# Patient Record
Sex: Male | Born: 1955 | Race: Black or African American | Hispanic: No | Smoking: Never smoker
Health system: Southern US, Community
[De-identification: ages and names within clinical notes are randomized; demographics above are authoritative.]

## PROBLEM LIST (undated history)

## (undated) DIAGNOSIS — I10 Essential (primary) hypertension: Secondary | ICD-10-CM

## (undated) DIAGNOSIS — E119 Type 2 diabetes mellitus without complications: Secondary | ICD-10-CM

## (undated) DIAGNOSIS — G4730 Sleep apnea, unspecified: Secondary | ICD-10-CM

## (undated) DIAGNOSIS — M1990 Unspecified osteoarthritis, unspecified site: Secondary | ICD-10-CM

## (undated) DIAGNOSIS — F32A Depression, unspecified: Secondary | ICD-10-CM

---

## 2022-01-02 ENCOUNTER — Emergency Department (HOSPITAL_COMMUNITY): Payer: No Typology Code available for payment source

## 2022-01-02 ENCOUNTER — Emergency Department (HOSPITAL_COMMUNITY): Payer: No Typology Code available for payment source | Admitting: Anesthesiology

## 2022-01-02 ENCOUNTER — Inpatient Hospital Stay (HOSPITAL_COMMUNITY)
Admission: EM | Admit: 2022-01-02 | Discharge: 2022-01-05 | DRG: 093 | Disposition: A | Discharge status: Rehabilitation Facility | Payer: No Typology Code available for payment source | Attending: Neurosurgery | Admitting: Neurosurgery

## 2022-01-02 ENCOUNTER — Encounter (HOSPITAL_COMMUNITY)
Admission: EM | Disposition: A | Discharge status: Rehabilitation Facility | Payer: Self-pay | Source: Home / Self Care | Attending: Neurosurgery

## 2022-01-02 DIAGNOSIS — M25511 Pain in right shoulder: Secondary | ICD-10-CM | POA: Diagnosis present

## 2022-01-02 DIAGNOSIS — G8191 Hemiplegia, unspecified affecting right dominant side: Secondary | ICD-10-CM | POA: Diagnosis present

## 2022-01-02 DIAGNOSIS — Y9355 Activity, bike riding: Secondary | ICD-10-CM

## 2022-01-02 DIAGNOSIS — Z20822 Contact with and (suspected) exposure to covid-19: Secondary | ICD-10-CM | POA: Diagnosis present

## 2022-01-02 DIAGNOSIS — T1490XA Injury, unspecified, initial encounter: Secondary | ICD-10-CM

## 2022-01-02 DIAGNOSIS — M25571 Pain in right ankle and joints of right foot: Secondary | ICD-10-CM | POA: Diagnosis present

## 2022-01-02 DIAGNOSIS — F32A Depression, unspecified: Secondary | ICD-10-CM | POA: Diagnosis present

## 2022-01-02 DIAGNOSIS — M1990 Unspecified osteoarthritis, unspecified site: Secondary | ICD-10-CM

## 2022-01-02 DIAGNOSIS — Z91040 Latex allergy status: Secondary | ICD-10-CM

## 2022-01-02 DIAGNOSIS — Z79899 Other long term (current) drug therapy: Secondary | ICD-10-CM

## 2022-01-02 DIAGNOSIS — E1142 Type 2 diabetes mellitus with diabetic polyneuropathy: Secondary | ICD-10-CM | POA: Diagnosis present

## 2022-01-02 DIAGNOSIS — S14109A Unspecified injury at unspecified level of cervical spinal cord, initial encounter: Secondary | ICD-10-CM | POA: Diagnosis present

## 2022-01-02 DIAGNOSIS — K5900 Constipation, unspecified: Secondary | ICD-10-CM | POA: Diagnosis present

## 2022-01-02 DIAGNOSIS — G9520 Unspecified cord compression: Principal | ICD-10-CM | POA: Diagnosis present

## 2022-01-02 DIAGNOSIS — M5030 Other cervical disc degeneration, unspecified cervical region: Secondary | ICD-10-CM | POA: Diagnosis present

## 2022-01-02 DIAGNOSIS — R102 Pelvic and perineal pain: Secondary | ICD-10-CM | POA: Diagnosis present

## 2022-01-02 DIAGNOSIS — Z8249 Family history of ischemic heart disease and other diseases of the circulatory system: Secondary | ICD-10-CM

## 2022-01-02 DIAGNOSIS — Z7984 Long term (current) use of oral hypoglycemic drugs: Secondary | ICD-10-CM

## 2022-01-02 DIAGNOSIS — E785 Hyperlipidemia, unspecified: Secondary | ICD-10-CM | POA: Diagnosis present

## 2022-01-02 DIAGNOSIS — E876 Hypokalemia: Secondary | ICD-10-CM | POA: Diagnosis present

## 2022-01-02 DIAGNOSIS — M4802 Spinal stenosis, cervical region: Secondary | ICD-10-CM | POA: Diagnosis present

## 2022-01-02 DIAGNOSIS — S31119A Laceration without foreign body of abdominal wall, unspecified quadrant without penetration into peritoneal cavity, initial encounter: Secondary | ICD-10-CM

## 2022-01-02 DIAGNOSIS — M2578 Osteophyte, vertebrae: Secondary | ICD-10-CM | POA: Diagnosis present

## 2022-01-02 DIAGNOSIS — I10 Essential (primary) hypertension: Secondary | ICD-10-CM | POA: Diagnosis present

## 2022-01-02 DIAGNOSIS — Z888 Allergy status to other drugs, medicaments and biological substances status: Secondary | ICD-10-CM

## 2022-01-02 HISTORY — DX: Essential (primary) hypertension: I10

## 2022-01-02 HISTORY — DX: Depression, unspecified: F32.A

## 2022-01-02 HISTORY — DX: Type 2 diabetes mellitus without complications: E11.9

## 2022-01-02 HISTORY — DX: Sleep apnea, unspecified: G47.30

## 2022-01-02 HISTORY — DX: Unspecified osteoarthritis, unspecified site: M19.90

## 2022-01-02 LAB — COMPREHENSIVE METABOLIC PANEL
ALT: 16 U/L (ref 0–44)
AST: 22 U/L (ref 15–41)
Albumin: 4 g/dL (ref 3.5–5.0)
Alkaline Phosphatase: 41 U/L (ref 38–126)
Anion gap: 9 (ref 5–15)
BUN: 9 mg/dL (ref 8–23)
CO2: 25 mmol/L (ref 22–32)
Calcium: 9.2 mg/dL (ref 8.9–10.3)
Chloride: 104 mmol/L (ref 98–111)
Creatinine, Ser: 1.07 mg/dL (ref 0.61–1.24)
GFR, Estimated: >60 mL/min (ref >60)
Glucose, Bld: 107 mg/dL — ABNORMAL HIGH (ref 70–99)
Potassium: 3.1 mmol/L — ABNORMAL LOW (ref 3.5–5.1)
Sodium: 138 mmol/L (ref 135–145)
Total Bilirubin: 1 mg/dL (ref 0.3–1.2)
Total Protein: 7 g/dL (ref 6.5–8.1)

## 2022-01-02 LAB — URINALYSIS, ROUTINE W REFLEX MICROSCOPIC
Bilirubin Urine: NEGATIVE
Glucose, UA: NEGATIVE mg/dL
Hgb urine dipstick: NEGATIVE
Ketones, ur: NEGATIVE mg/dL
Leukocytes,Ua: NEGATIVE
Nitrite: NEGATIVE
Protein, ur: 100 mg/dL — AB
Specific Gravity, Urine: 1.02 (ref 1.005–1.030)
pH: 7.5 (ref 5.0–8.0)

## 2022-01-02 LAB — CBC
HCT: 35.4 % — ABNORMAL LOW (ref 39.0–52.0)
Hemoglobin: 11.3 g/dL — ABNORMAL LOW (ref 13.0–17.0)
MCH: 20 pg — ABNORMAL LOW (ref 26.0–34.0)
MCHC: 31.9 g/dL (ref 30.0–36.0)
MCV: 62.7 fL — ABNORMAL LOW (ref 80.0–100.0)
Platelets: 277 10*3/uL (ref 150–400)
RBC: 5.65 MIL/uL (ref 4.22–5.81)
RDW: 17.7 % — ABNORMAL HIGH (ref 11.5–15.5)
WBC: 12.7 10*3/uL — ABNORMAL HIGH (ref 4.0–10.5)
nRBC: 0 % (ref 0.0–0.2)

## 2022-01-02 LAB — URINALYSIS, MICROSCOPIC (REFLEX)

## 2022-01-02 LAB — I-STAT CHEM 8, ED
BUN: 10 mg/dL (ref 8–23)
Calcium, Ion: 1.16 mmol/L (ref 1.15–1.40)
Chloride: 101 mmol/L (ref 98–111)
Creatinine, Ser: 0.9 mg/dL (ref 0.61–1.24)
Glucose, Bld: 109 mg/dL — ABNORMAL HIGH (ref 70–99)
HCT: 40 % (ref 39.0–52.0)
Hemoglobin: 13.6 g/dL (ref 13.0–17.0)
Potassium: 3.2 mmol/L — ABNORMAL LOW (ref 3.5–5.1)
Sodium: 140 mmol/L (ref 135–145)
TCO2: 28 mmol/L (ref 22–32)

## 2022-01-02 LAB — SAMPLE TO BLOOD BANK

## 2022-01-02 LAB — RESP PANEL BY RT-PCR (FLU A&B, COVID) ARPGX2
Influenza A by PCR: NEGATIVE
Influenza B by PCR: NEGATIVE
SARS Coronavirus 2 by RT PCR: NEGATIVE

## 2022-01-02 LAB — PROTIME-INR
INR: 1.1 (ref 0.8–1.2)
Prothrombin Time: 14.2 seconds (ref 11.4–15.2)

## 2022-01-02 LAB — LACTIC ACID, PLASMA: Lactic Acid, Venous: 2.1 mmol/L (ref 0.5–1.9)

## 2022-01-02 LAB — CBG MONITORING, ED: Glucose-Capillary: 126 mg/dL — ABNORMAL HIGH (ref 70–99)

## 2022-01-02 LAB — ETHANOL: Alcohol, Ethyl (B): <10 mg/dL (ref <10)
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
## 2022-01-02 IMAGING — MR MR HEAD W/O CM
6 of 11 series · 26 of 48 positions shown · non-contrast
Comparison: CT from earlier the same day.

CLINICAL DATA: Initial evaluation for neuro deficit, stroke
suspected.

EXAM:
MRI HEAD WITHOUT CONTRAST
TECHNIQUE: Multiplanar, multiecho pulse sequences of the brain and surrounding
structures were obtained without intravenous contrast.

[Series 3: DWI · axial · 3.0mm · 0.94mm/px · z∈[-54,+111]mm · 9 of 112 slices shown (1 of 2)]
[im 1/112]
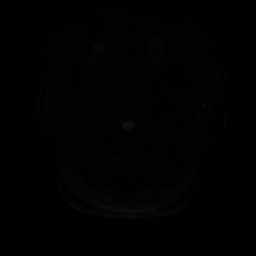
[im 14/112]
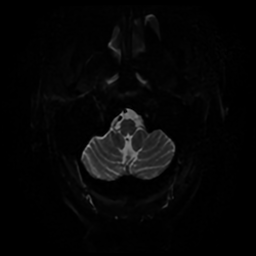
[im 28/112]
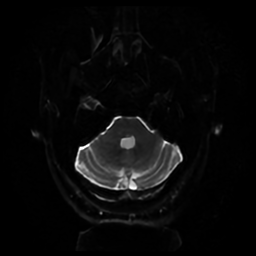
[im 42/112]
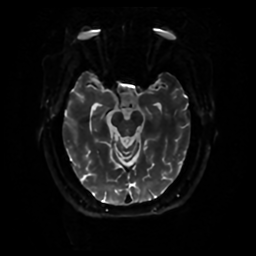
[im 56/112]
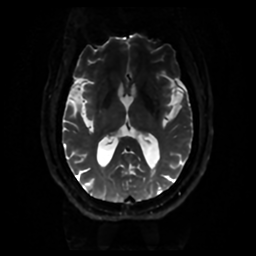
[im 70/112]
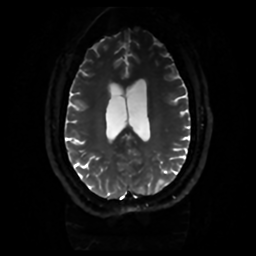
[im 84/112]
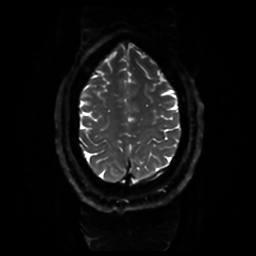
[im 98/112]
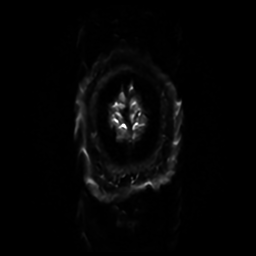
[im 112/112]
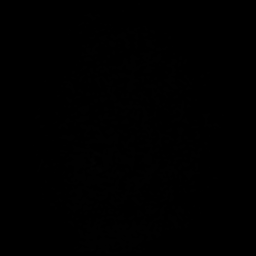

[Series 4: DWI · coronal · 4.0mm · 0.94mm/px · 5 of 76 slices shown (2 of 2)]
[im 1/76]
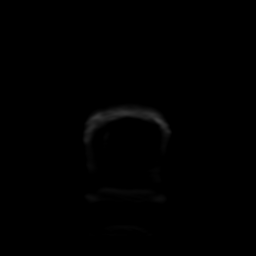
[im 19/76]
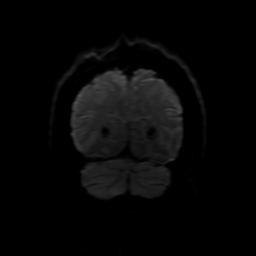
[im 38/76]
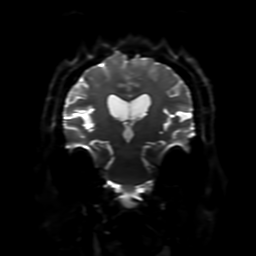
[im 57/76]
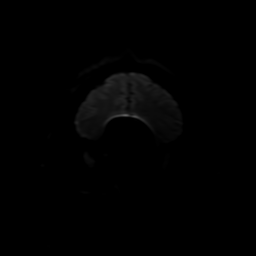
[im 76/76]
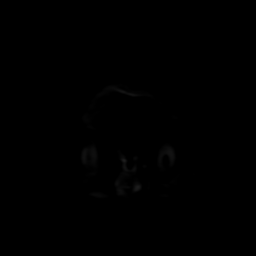

[Series 5: FLAIR · sagittal · 5.0mm · 0.23mm/px · 2 of 26 slices shown (1 of 2)]
[im 1/26]
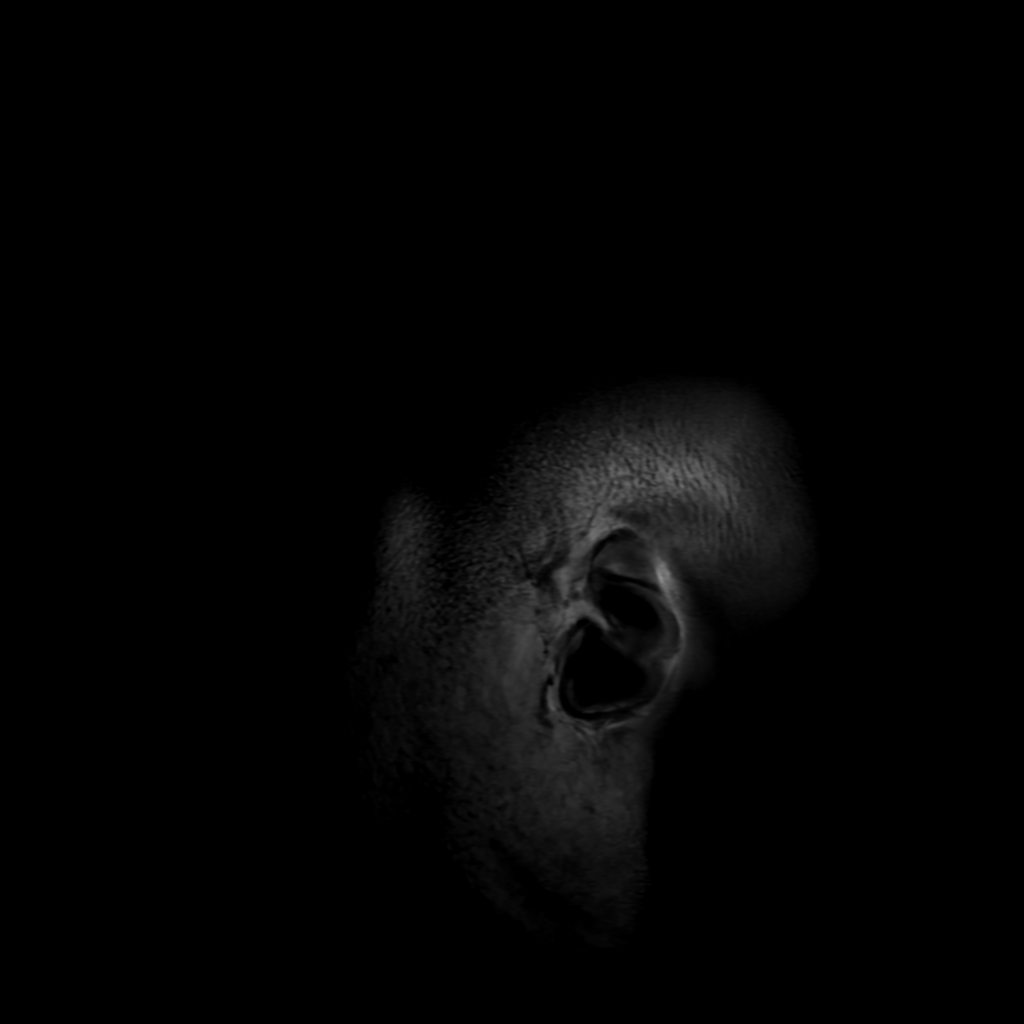
[im 26/26]
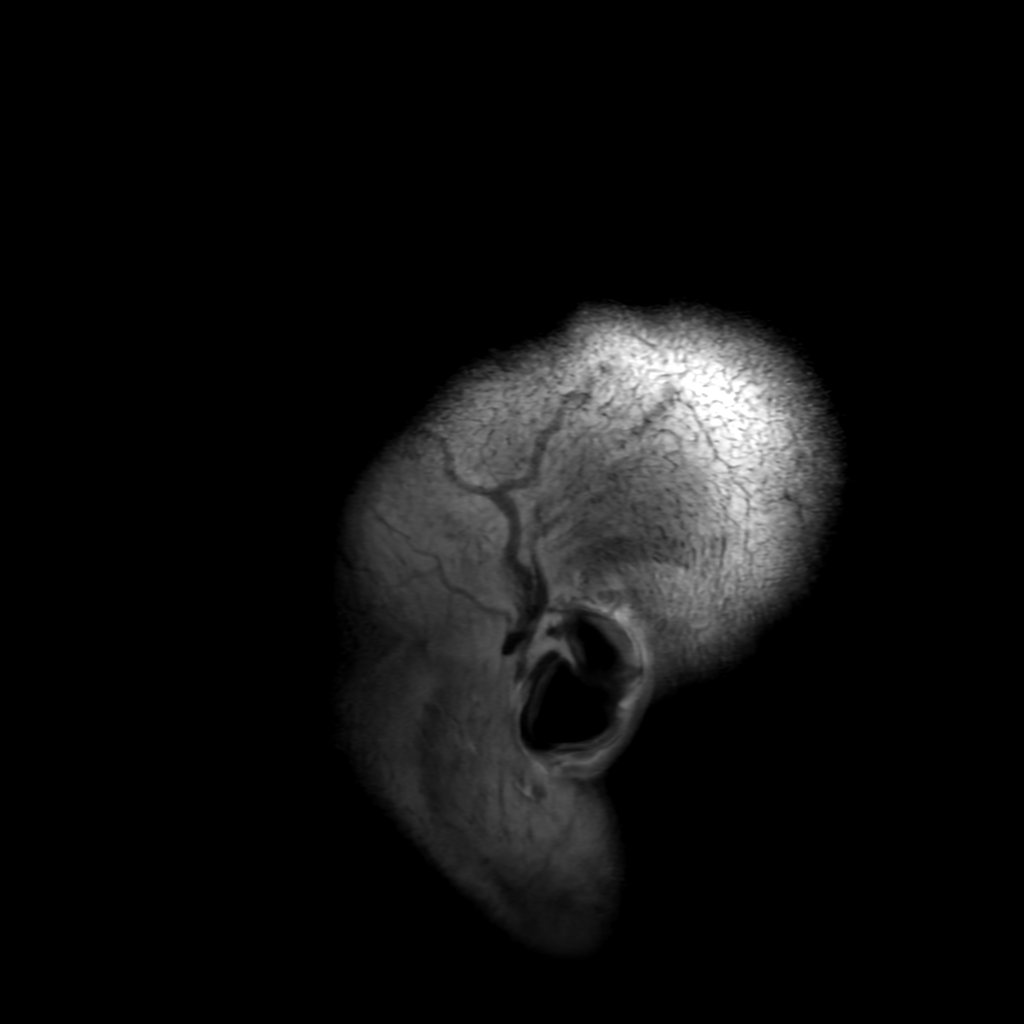

[Series 7: FLAIR · axial · 4.0mm · 0.45mm/px · z∈[-49,+113]mm · 3 of 38 slices shown (2 of 2)]
[im 1/38]
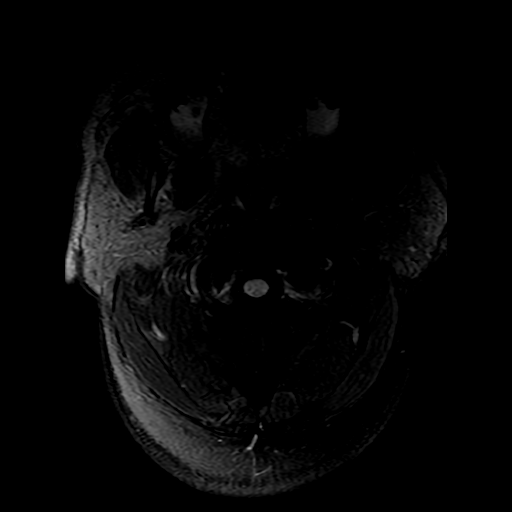
[im 19/38]
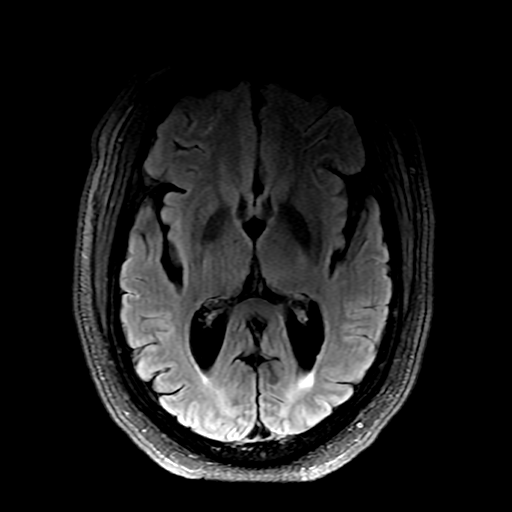
[im 38/38]
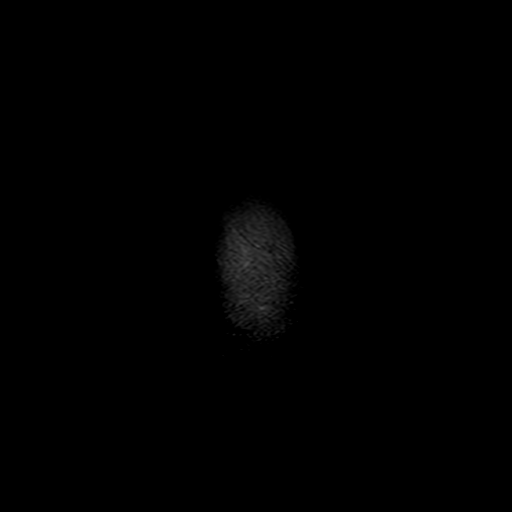

[Series 350: ADC · axial · 3.0mm · 0.94mm/px · z∈[-54,+111]mm · 4 of 56 slices shown (1 of 2)]
[im 1/56]
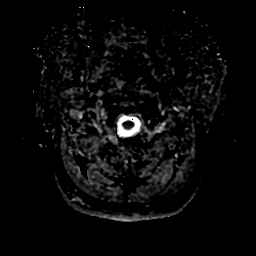
[im 19/56]
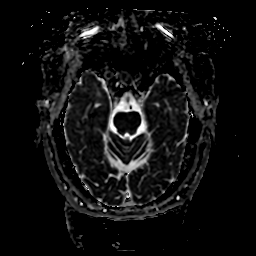
[im 37/56]
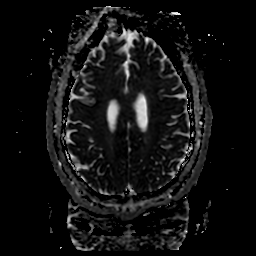
[im 56/56]
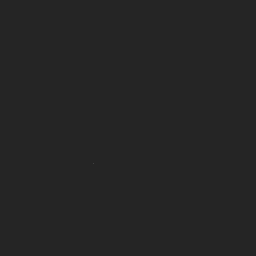

[Series 450: ADC · coronal · 4.0mm · 0.94mm/px · 3 of 38 slices shown (2 of 2)]
[im 1/38]
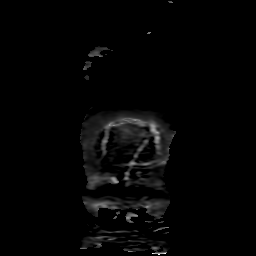
[im 19/38]
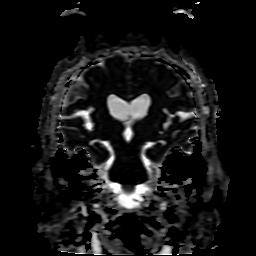
[im 38/38]
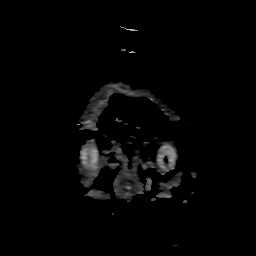

[26 of 48 positions shown; findings below may reference images not displayed]

FINDINGS: Brain: Cerebral volume within normal limits. Small remote lacunar
infarct noted at the right caudate body. No significant cerebral
white matter disease.

No evidence for acute or subacute infarct. Gray-white matter
differentiation maintained. No remote cortical infarction. No acute
or chronic intracranial hemorrhage.

No mass lesion, mass effect or midline shift. No hydrocephalus or
extra-axial fluid collection. Pituitary gland suprasellar region
normal. Right cerebellar DVA noted.

Vascular: Major intracranial vascular flow voids are maintained.

Skull and upper cervical spine: Craniocervical junction normal. Bone
marrow signal intensity within normal limits. No visible scalp soft
tissue abnormality by MRI.

Sinuses/Orbits: Globes and orbital soft tissues demonstrate no acute
finding. Scattered retention cyst noted within the maxillary
sinuses. No significant mastoid effusion.

Other: None.
IMPRESSION: 1. No acute intracranial abnormality.
2. Small remote lacunar infarct involving the right caudate body.
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
## 2022-01-02 IMAGING — MR MR CERVICAL SPINE W/O CM
4 of 6 series · 16 of 48 positions shown · non-contrast
Comparison: CT from earlier the same day.

CLINICAL DATA: Initial evaluation for trauma, paresthesias.

EXAM:
MRI CERVICAL SPINE WITHOUT CONTRAST
TECHNIQUE: Multiplanar, multisequence MR imaging of the cervical spine was
performed. No intravenous contrast was administered.

[Series 2: T2 · sagittal · 3.0mm · 0.43mm/px · 4 of 18 slices shown (1 of 2)]
[im 1/18]
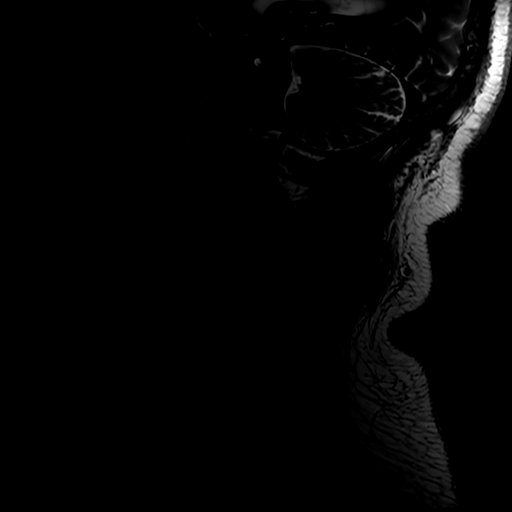
[im 6/18]
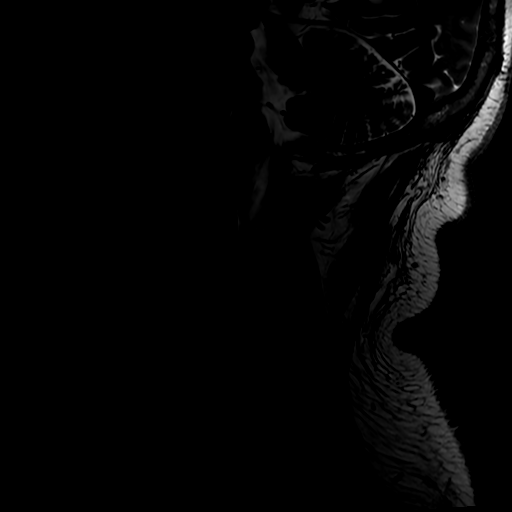
[im 12/18]
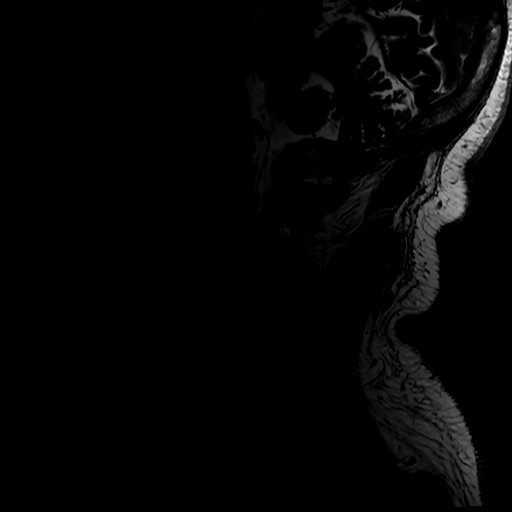
[im 18/18]
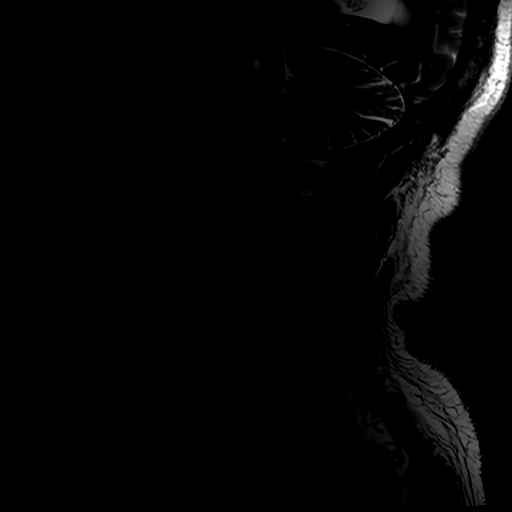

[Series 4: STIR · sagittal · 3.0mm · 0.43mm/px · 3 of 18 slices shown]
[im 1/18]
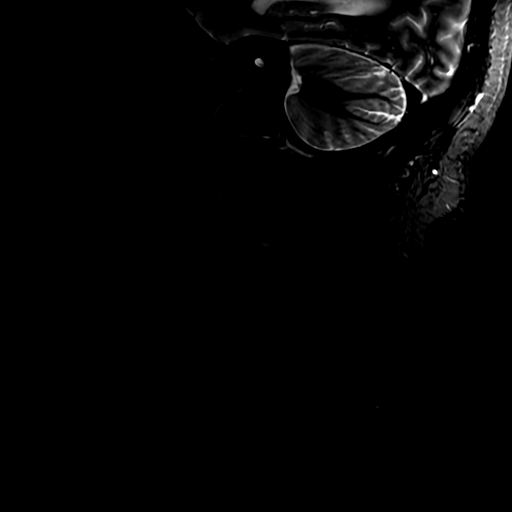
[im 9/18]
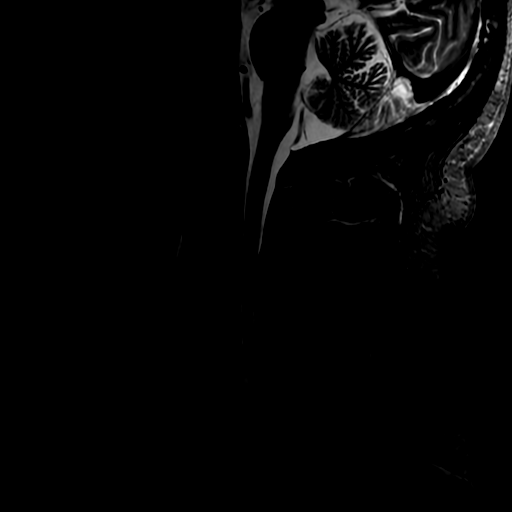
[im 18/18]
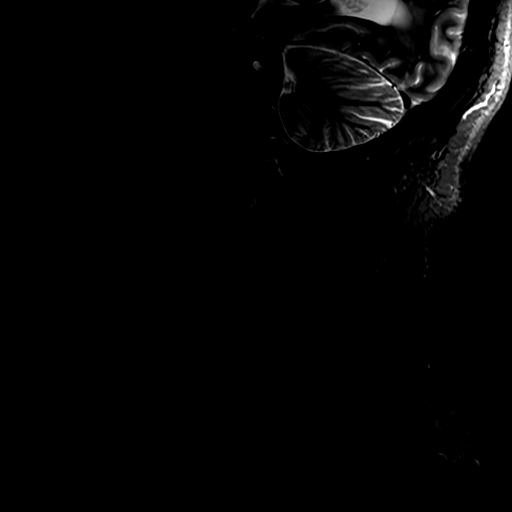

[Series 6: T2 · axial · 3.0mm · 0.35mm/px · z∈[-226,-118]mm · 6 of 40 slices shown (2 of 2)]
[im 1/40]
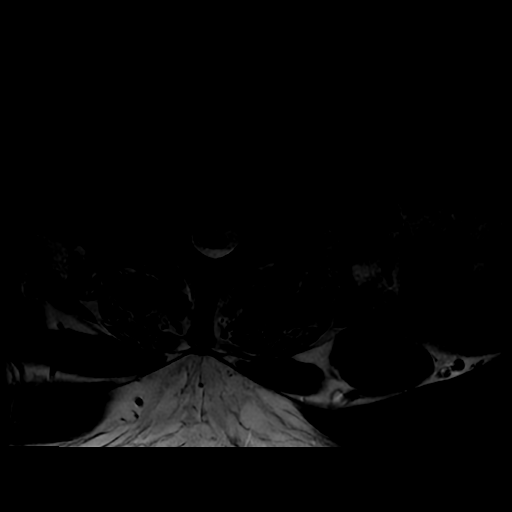
[im 6/40]
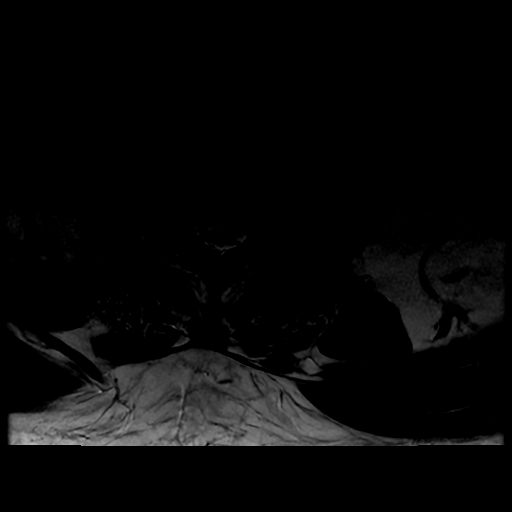
[im 12/40]
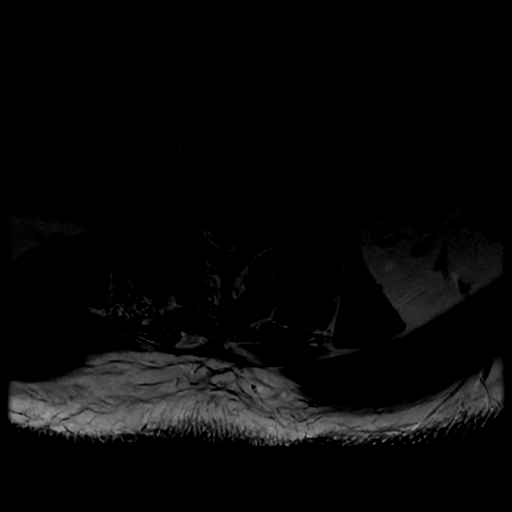
[im 17/40]
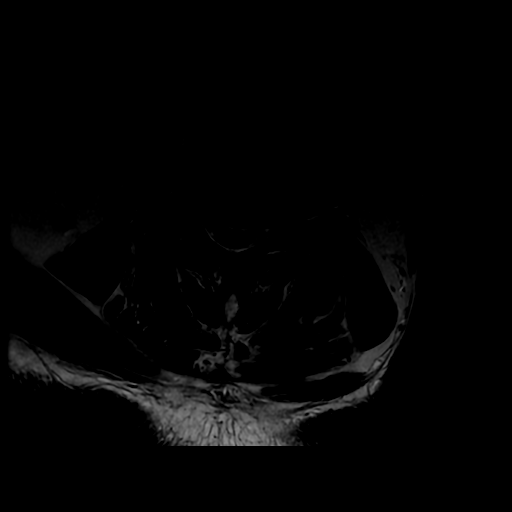
[im 23/40]
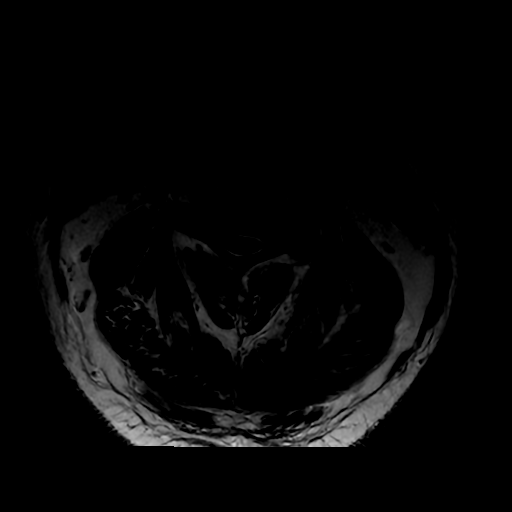
[im 34/40]
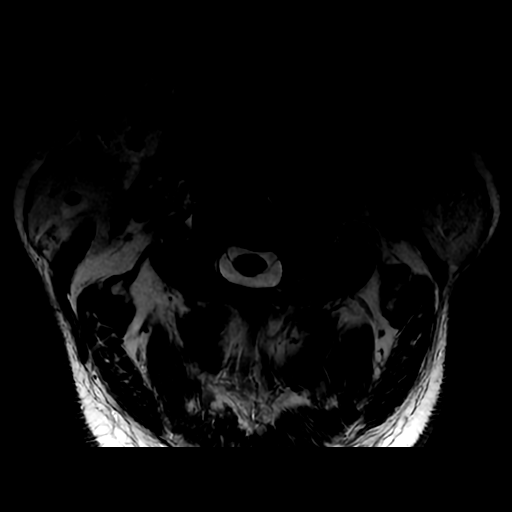

[Series 7: T1 · axial · non-contrast · 3.0mm · 0.35mm/px · z∈[-210,-118]mm · 3 of 40 slices shown]
[im 6/40]
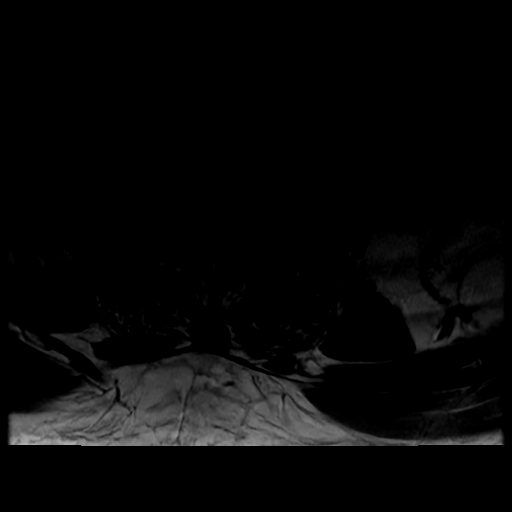
[im 23/40]
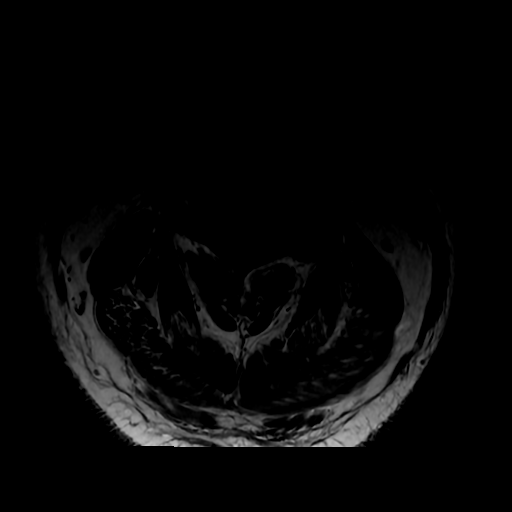
[im 34/40]
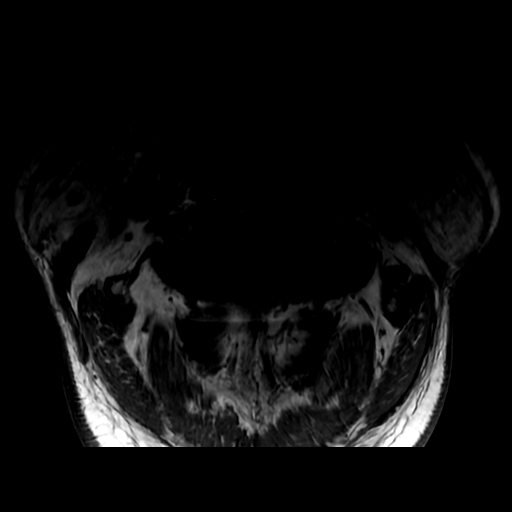

[16 of 48 positions shown; findings below may reference images not displayed]

FINDINGS: Alignment: Straightening of the normal cervical lordosis. Trace
retrolisthesis of C3 on C4, likely chronic and degenerative.

Vertebrae: There is question of a T2/stir hyperintense linear defect
extending through an anterior osteophyte at the level of C7-T1
(series 4, image 8). Focal lucency seen at this level on prior CT.
While this finding is age indeterminate, a possible acute
nondisplaced fracture could be present. Otherwise, vertebral body
height maintained with no other visible acute or chronic fracture.
Bone marrow signal intensity heterogeneous without worrisome osseous
lesion. No other abnormal marrow edema.

Cord: Patchy signal abnormality seen involving the cervical spinal
cord at the level of C3-4, suspicious for acute cord
injury/contusion (series 6, image 16). Additional signal abnormality
noted involving the right dorsal cord slightly inferiorly at the
level of C4-5 (series 6, image 21), also suspicious for cord injury.
Probable involvement of the left hemi cord at this level as well,
best seen on sagittal sequence (series 2, image 9).

Posterior Fossa, vertebral arteries, paraspinal tissues: Visualized
brain and posterior fossa within normal limits. Craniocervical
junction normal. Question of paraspinous edema involving the
prevertebral soft tissues at C6 through the upper thoracic spine,
suspected to be related to the adjacent acute nondisplaced fracture
as above (series 4, image 8). Question possible disruption of the
anterior longitudinal ligament at the level of the fracture noted as
well (series 4, image 6). Ligamentous structures otherwise intact.
Normal flow voids seen within the vertebral arteries bilaterally.

Disc levels:

C2-C3: Negative interspace. Mild facet hypertrophy. No significant
spinal stenosis. Foramina remain patent.

C3-C4: Trace retrolisthesis with intervertebral disc space
narrowing. Diffuse disc osteophyte complex with bilateral
uncovertebral spurring. Broad posterior component flattens and
effaces the ventral thecal sac. Superimposed facet and ligament
flavum hypertrophy. Resultant severe spinal stenosis with the thecal
sac measuring 7 mm in AP diameter. Secondary cord flattening with
cord signal changes as above. Severe bilateral C4 foraminal
stenosis.

C4-C5: Degenerative intervertebral disc space narrowing with diffuse
disc osteophyte complex. Broad posterior component flattens and
effaces the ventral thecal sac. Mild cord flattening with associated
right-sided cord signal changes as above. Moderate spinal stenosis.
Mild bilateral C5 foraminal narrowing.

C5-C6: Degenerative intervertebral disc space narrowing with diffuse
disc osteophyte complex. Mild flattening of the ventral thecal sac
without significant spinal stenosis. Moderate left worse than right
C6 foraminal narrowing.

C6-C7: Minimal disc bulge with uncovertebral spurring. No spinal
stenosis. Mild left C7 foraminal narrowing. Right neural foramina
remains patent.

C7-T1: Mild disc bulge with endplate and uncovertebral spurring.
Mild facet hypertrophy. No spinal stenosis. Mild to moderate right
worse than left C8 foraminal narrowing.
IMPRESSION: 1. Question nondisplaced fracture extending through an anterior
bridging osteophyte at the level of C7-T1. While this finding is age
indeterminate, there is adjacent prevertebral edema suggesting that
this is could be acute in nature. Correlation with physical exam for
possible pain at this location recommended.
2. Question possible defect extending through the anterior
longitudinal ligament at the level of the above described C7-T1
fracture, which could reflect focal ligamentous injury. No other
evidence for ligamentous injury within the cervical spine.
3. Patchy signal abnormality involving the cervical spinal cord at
the level of C3-4 and C4-5 as above, concerning for acute cord
injury/contusion.
4. Multifactorial degenerative changes at C3-4 and C4-5 with
resultant moderate to severe spinal stenosis. Associated severe
bilateral C4, mild C5, and moderate C6 foraminal narrowing as above.
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
## 2022-01-02 IMAGING — CT CT HEAD W/O CM
4 series · 16 of 47 positions shown, 18 images · non-contrast
Comparison: None.

CLINICAL DATA: Fall.

EXAM:
CT HEAD WITHOUT CONTRAST
CT CERVICAL SPINE WITHOUT CONTRAST
TECHNIQUE: Multidetector CT imaging of the head and cervical spine was
performed following the standard protocol without intravenous
contrast. Multiplanar CT image reconstructions of the cervical spine
were also generated.

[Series 3: head wo · axial · 0.45mm/px · z∈[-124,-4]mm · 7 of 34 slices shown, 9 images]
[im 5/34  brain]
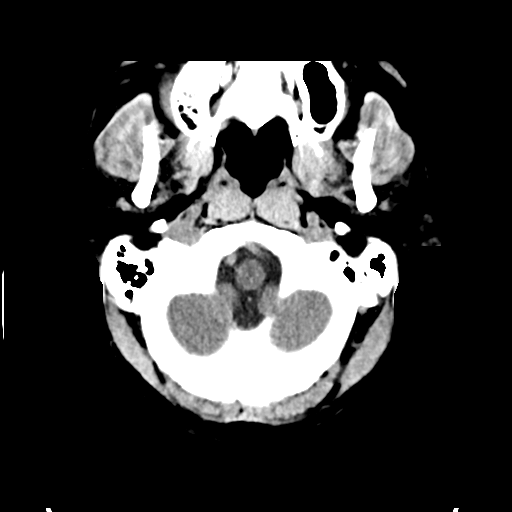
[im 5/34  bone]
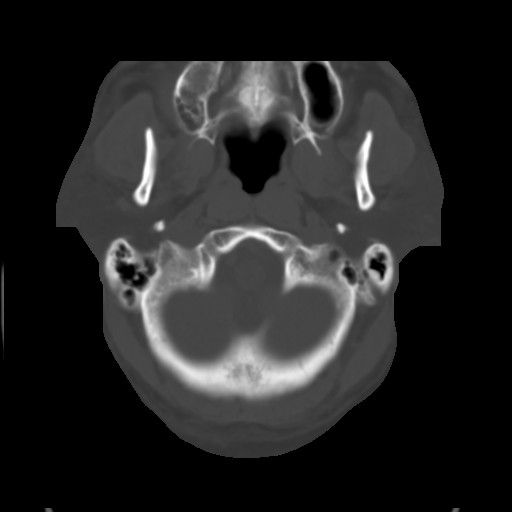
[im 9/34  brain]
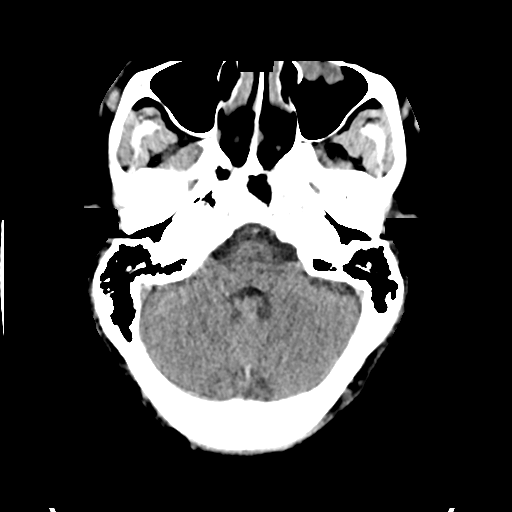
[im 13/34  brain]
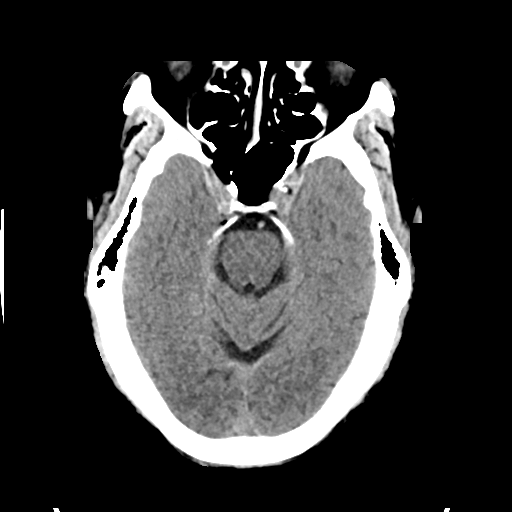
[im 17/34  brain]
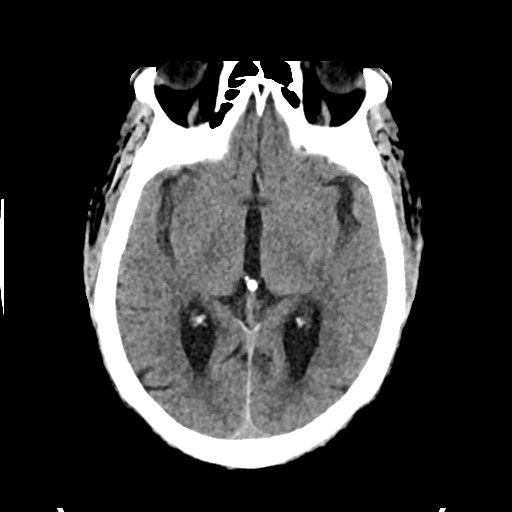
[im 21/34  brain]
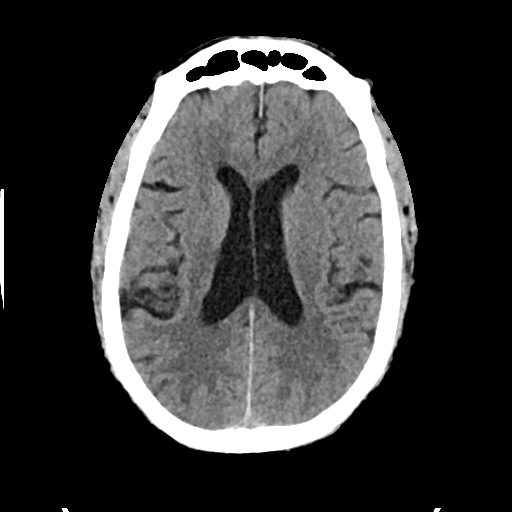
[im 21/34  bone]
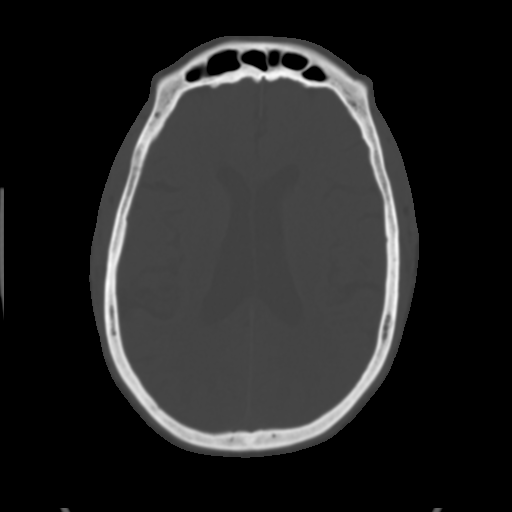
[im 25/34  brain]
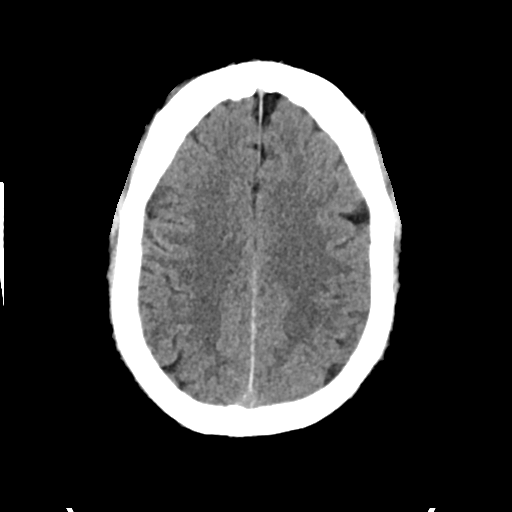
[im 29/34  brain]
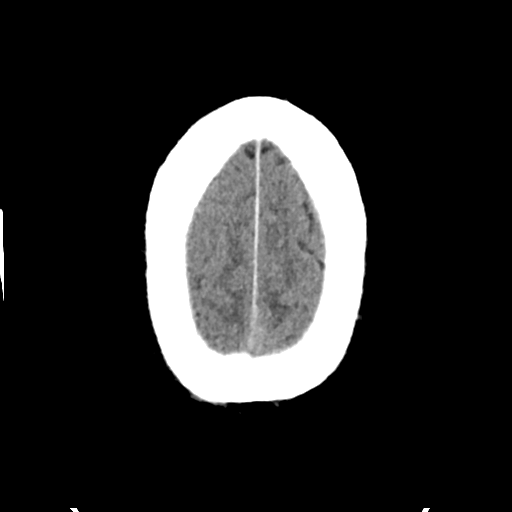

[Series 4: head bone · axial · 0.45mm/px · z∈[-128,-96]mm · 3 of 83 slices shown]
[im 9/83  bone]
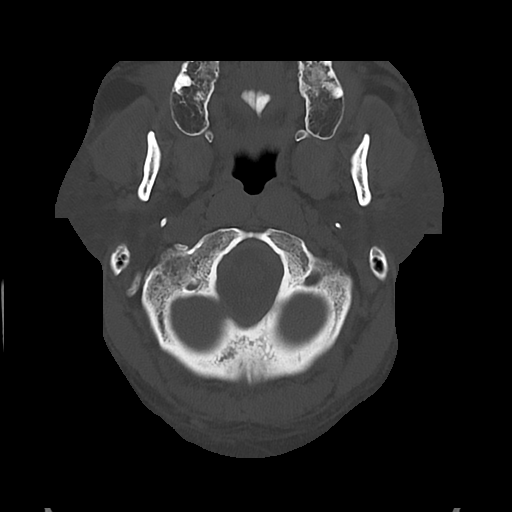
[im 17/83  bone]
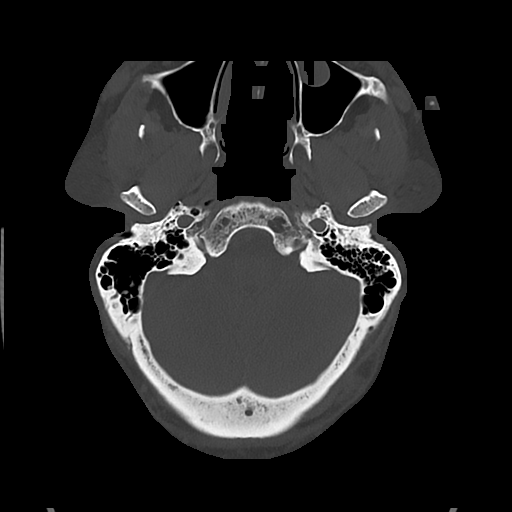
[im 25/83  bone]
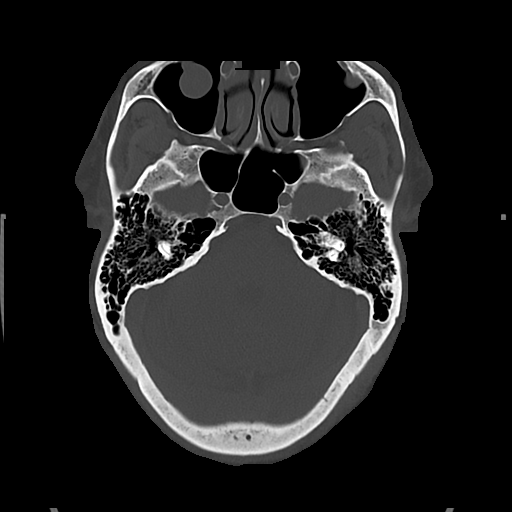

[Series 5: cor soft · coronal · 0.37mm/px · 3 of 75 slices shown]
[im 25/75  brain]
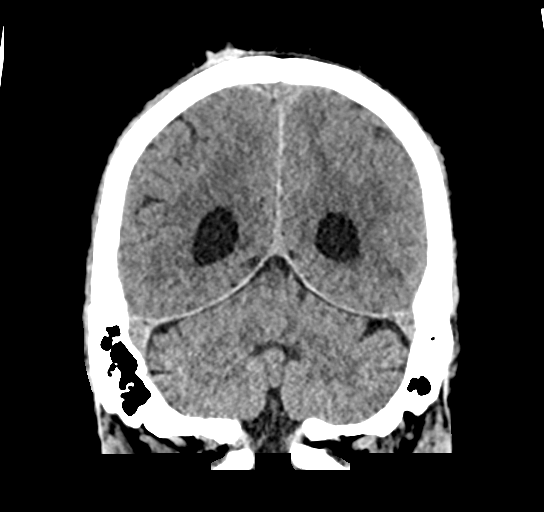
[im 33/75  brain]
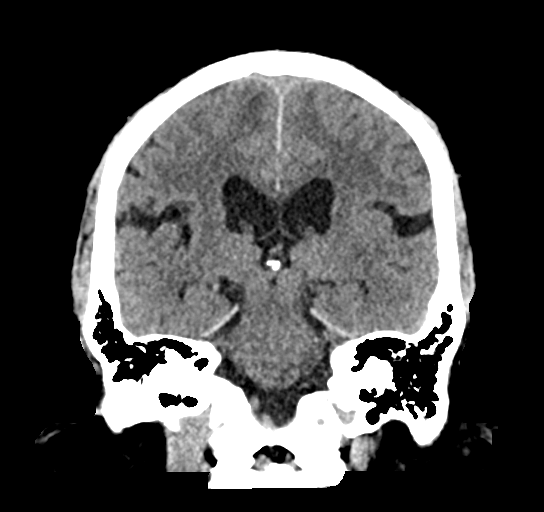
[im 42/75  brain]
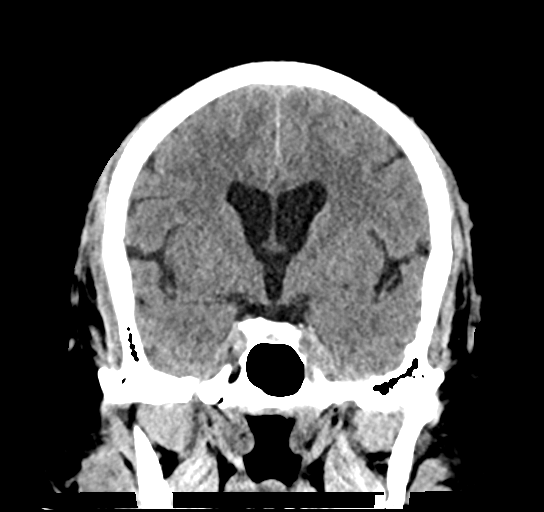

[Series 6: sag soft · sagittal · 0.37mm/px · 3 of 68 slices shown]
[im 23/68  brain]
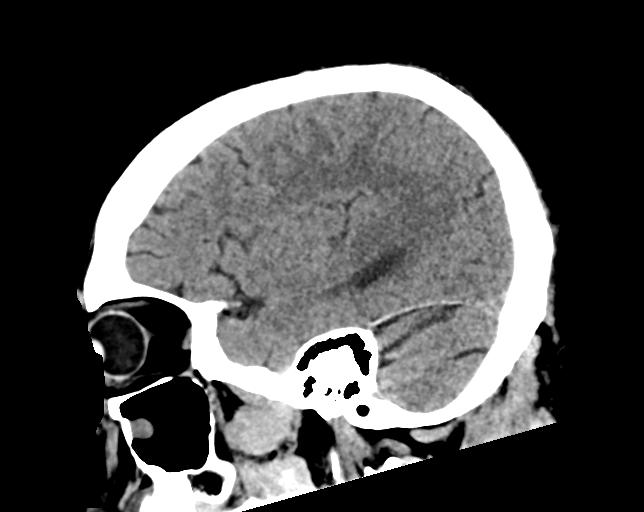
[im 34/68  brain]
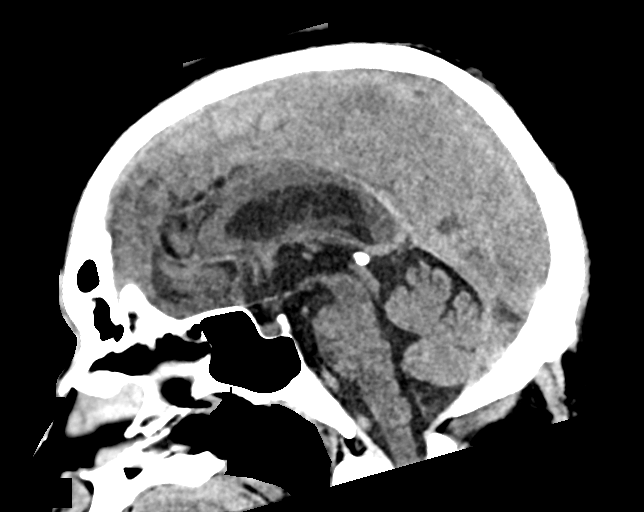
[im 45/68  brain]
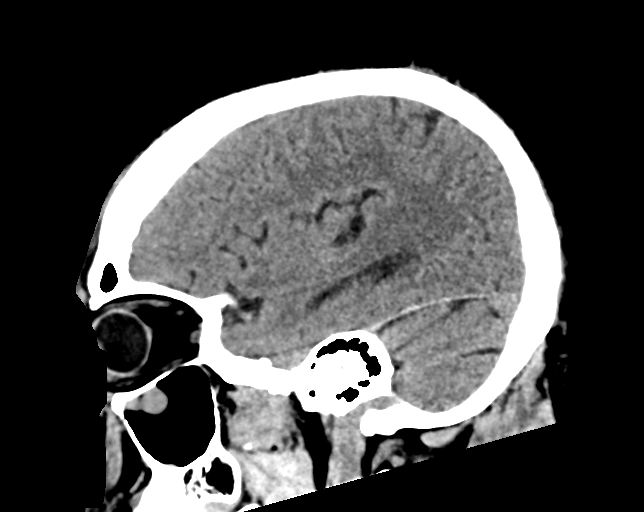

[16 of 47 positions shown; findings below may reference images not displayed]

FINDINGS: CT HEAD FINDINGS

Brain: No evidence of acute infarction, hemorrhage, hydrocephalus,
extra-axial collection or mass lesion/mass effect.

Vascular: No hyperdense vessel or unexpected calcification.

Skull: Normal. Negative for fracture or focal lesion.

Sinuses/Orbits: No acute finding.

Other: Small right posterior scalp hematoma is noted.

CT CERVICAL SPINE FINDINGS

Alignment: Minimal grade 1 retrolisthesis of C4-5 is noted secondary
to severe degenerative disc disease at this level.

Skull base and vertebrae: No acute fracture. No primary bone lesion
or focal pathologic process.

Soft tissues and spinal canal: No prevertebral fluid or swelling. No
visible canal hematoma.

Disc levels: Severe degenerative disc disease is noted at C4-5 and
C5-6. Moderate degenerative disc disease is noted at C3-4. Moderate
to large amount of anterior osteophyte formation is noted at all
levels of the cervical spine.

Upper chest: Negative.

Other: None.
IMPRESSION: Small right posterior scalp hematoma. No acute intracranial
abnormality seen.

Multilevel degenerative disc disease is noted in the cervical spine.
No acute abnormality is noted.

## 2022-01-02 IMAGING — MR MR THORACIC SPINE W/O CM
4 of 6 series · 16 of 48 positions shown · non-contrast
Comparison: Prior CT from earlier the same day.
COMPARISON: Prior CT from earlier the same day.

Addendum:
CLINICAL DATA: Initial evaluation for numbness and tingling,
weakness in right arm and leg.

EXAM:
MRI THORACIC SPINE WITHOUT CONTRAST
TECHNIQUE: Multiplanar, multisequence MR imaging of the thoracic spine was
performed. No intravenous contrast was administered.

[Series 5: T1 · sagittal · 3.0mm · 0.90mm/px · 3 of 16 slices shown (1 of 2)]
[im 1/16]
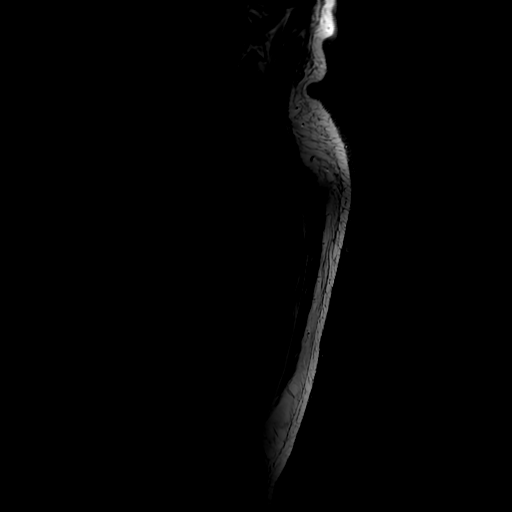
[im 8/16]
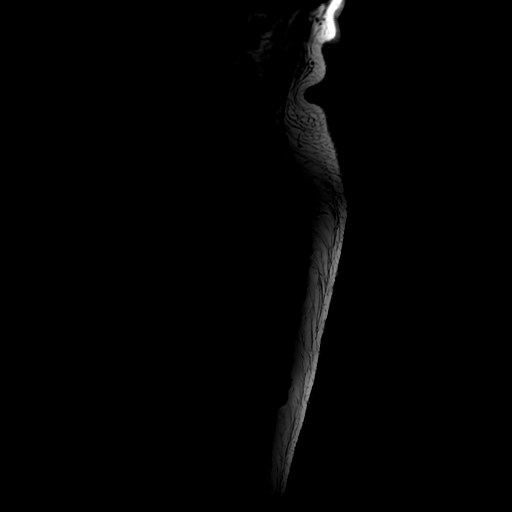
[im 16/16]
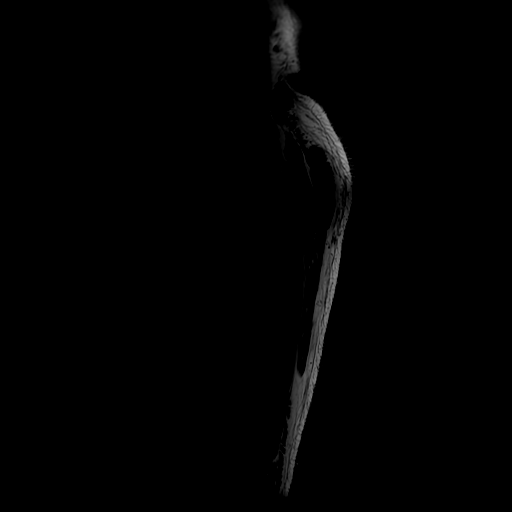

[Series 7: T2 · sagittal · 3.0mm · 0.66mm/px · 5 of 18 slices shown (1 of 2)]
[im 1/18]
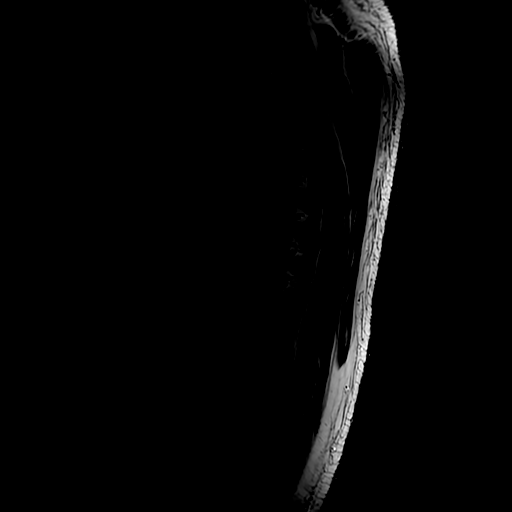
[im 5/18]
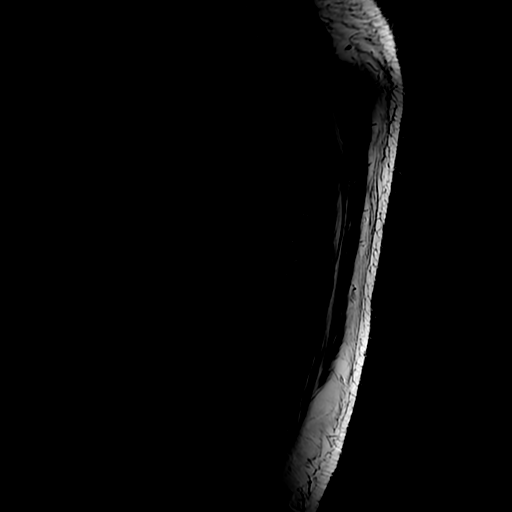
[im 9/18]
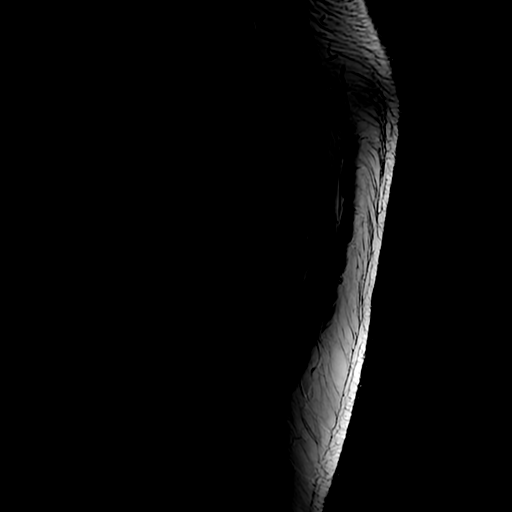
[im 13/18]
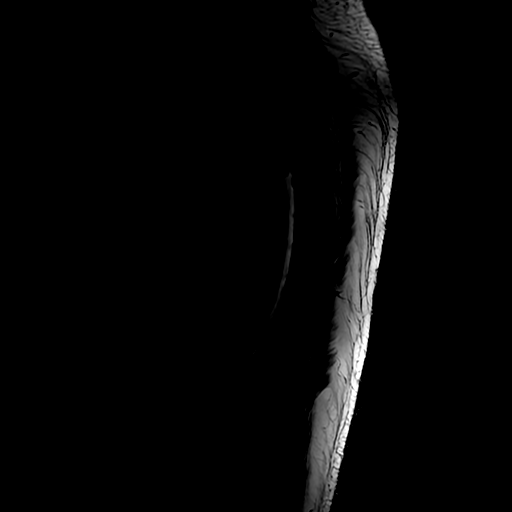
[im 18/18]
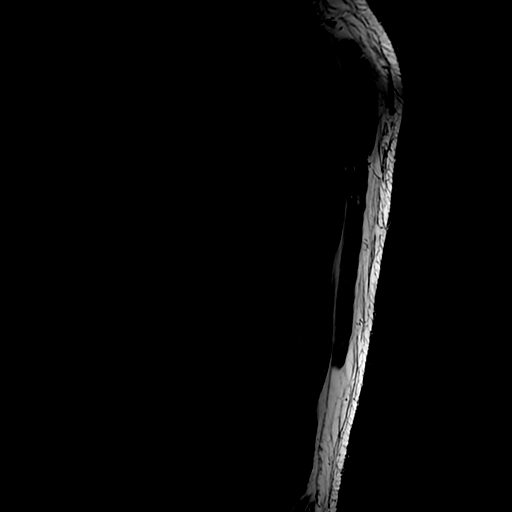

[Series 9: T1 · sagittal · 3.0mm · 0.66mm/px · 3 of 18 slices shown (2 of 2)]
[im 1/18]
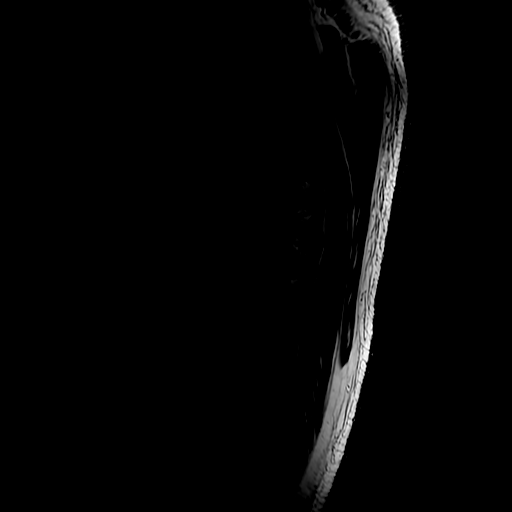
[im 9/18]
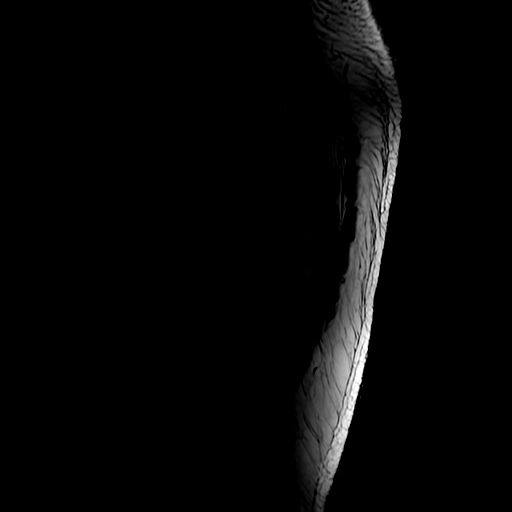
[im 18/18]
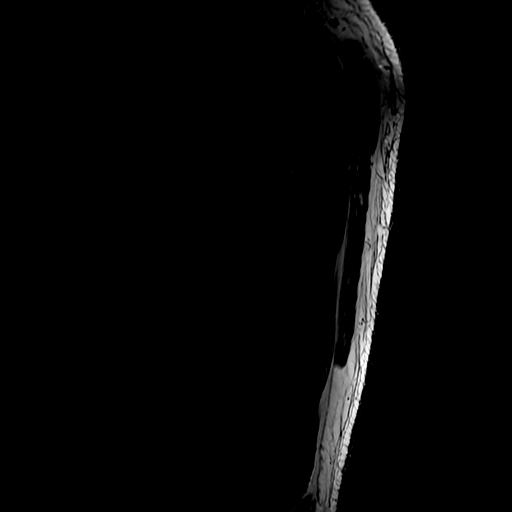

[Series 10: T2 · axial · 4.0mm · 0.39mm/px · z∈[-454,-203]mm · 5 of 55 slices shown (2 of 2)]
[im 1/55]
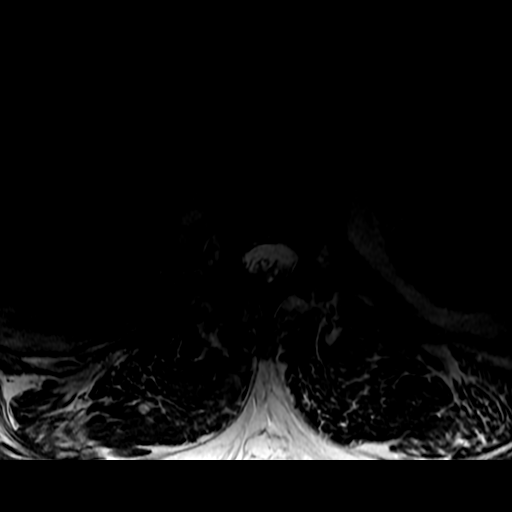
[im 8/55]
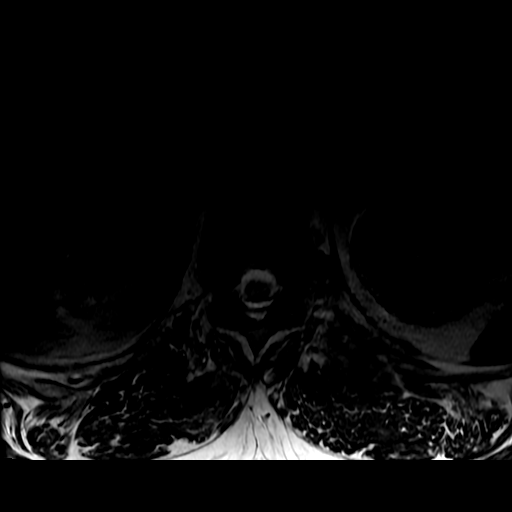
[im 16/55]
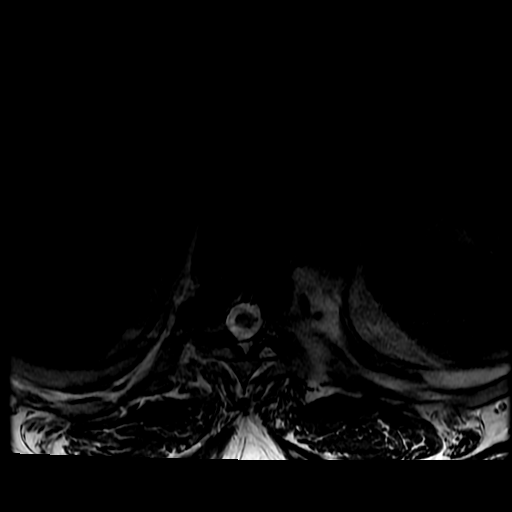
[im 28/55]
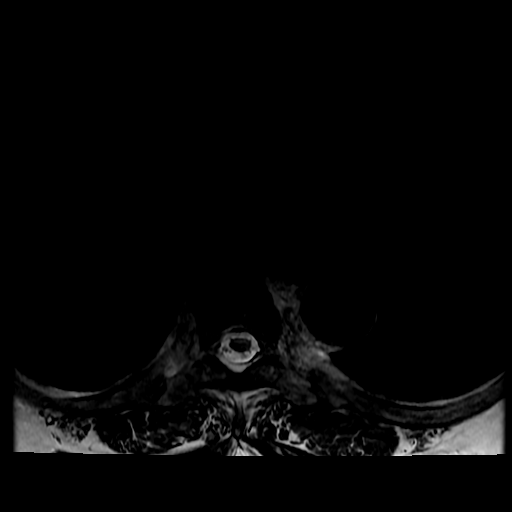
[im 47/55]
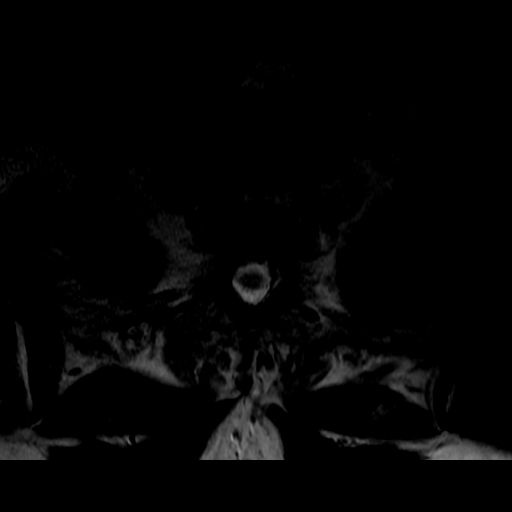

[16 of 48 positions shown; findings below may reference images not displayed]

FINDINGS: Alignment: Dextroscoliosis. Alignment otherwise normal with
preservation of the normal thoracic kyphosis. No listhesis.

Vertebrae: Vertebral body height maintained without acute or chronic
fracture. Bone marrow signal intensity within normal limits. No
worrisome osseous lesions.

Cord:  Normal signal and morphology.

Paraspinal and other soft tissues: Unremarkable.

Disc levels:

T1-2: Disc bulge with small right paracentral disc protrusion with
slight superior migration. No spinal stenosis. Foramina remain
patent.

T2-3: Minimal disc bulge. Right-sided facet hypertrophy. No
stenosis.

T3-4:  Negative interspace.  Mild facet hypertrophy.  No stenosis.

T4-5: Mild disc bulge with endplate spurring. Mild right greater
left facet hypertrophy. No spinal stenosis. Foramina remain patent.

T5-6: Mild disc bulge with endplate spurring. Mild posterior element
hypertrophy. No stenosis.

T6-7: Small central disc protrusion indents the ventral thecal sac
(series 10, image 26). No significant spinal stenosis or cord
deformity. Foramina remain patent.

T7-8: Small central disc protrusion indents the ventral thecal sac
(series 10, image 29). No significant spinal stenosis or cord
deformity. Foramina remain patent.

T8-9: Mild disc bulge with superimposed tiny left paracentral disc
protrusion. No stenosis or cord impingement. Foramina remain patent.

T9-10: Mild left eccentric disc bulge. Mild facet hypertrophy. No
spinal stenosis. Mild bilateral foraminal narrowing.

T10-11: Minimal annular disc bulge. Left-sided facet hypertrophy. No
spinal stenosis. Mild right with moderate left foraminal stenosis.

T11-12: Minimal disc bulge. Left greater than right facet
hypertrophy. No spinal stenosis. Foramina remain patent.

T12-L1: Negative interspace. Bilateral facet hypertrophy. No
stenosis.
IMPRESSION: 1. No acute abnormality within the thoracic spine or spinal cord.
2. Multilevel thoracic spondylosis with small central disc
protrusions at T6-7 and T7-8 without significant stenosis or overt
neural impingement.
3. Mild to moderate bilateral foraminal stenosis at T9-10 and T10-11
related disc bulge and facet hypertrophy as above.

ADDENDUM:
Upon further review, there is question of a linear defect extending
through an anterior osteophyte at the level of C7-T1 (series 8,
image 11). While this finding is somewhat age indeterminate, there
is edema involving the adjacent prevertebral soft tissues,
suggesting that this may be acute in nature. Findings are somewhat
better appreciated on corresponding cervical spine portion of this
exam.

*** End of Addendum ***
FINDINGS: Alignment: Dextroscoliosis. Alignment otherwise normal with
preservation of the normal thoracic kyphosis. No listhesis.

Vertebrae: Vertebral body height maintained without acute or chronic
fracture. Bone marrow signal intensity within normal limits. No
worrisome osseous lesions.

Cord:  Normal signal and morphology.

Paraspinal and other soft tissues: Unremarkable.

Disc levels:

T1-2: Disc bulge with small right paracentral disc protrusion with
slight superior migration. No spinal stenosis. Foramina remain
patent.

T2-3: Minimal disc bulge. Right-sided facet hypertrophy. No
stenosis.

T3-4:  Negative interspace.  Mild facet hypertrophy.  No stenosis.

T4-5: Mild disc bulge with endplate spurring. Mild right greater
left facet hypertrophy. No spinal stenosis. Foramina remain patent.

T5-6: Mild disc bulge with endplate spurring. Mild posterior element
hypertrophy. No stenosis.

T6-7: Small central disc protrusion indents the ventral thecal sac
(series 10, image 26). No significant spinal stenosis or cord
deformity. Foramina remain patent.

T7-8: Small central disc protrusion indents the ventral thecal sac
(series 10, image 29). No significant spinal stenosis or cord
deformity. Foramina remain patent.

T8-9: Mild disc bulge with superimposed tiny left paracentral disc
protrusion. No stenosis or cord impingement. Foramina remain patent.

T9-10: Mild left eccentric disc bulge. Mild facet hypertrophy. No
spinal stenosis. Mild bilateral foraminal narrowing.

T10-11: Minimal annular disc bulge. Left-sided facet hypertrophy. No
spinal stenosis. Mild right with moderate left foraminal stenosis.

T11-12: Minimal disc bulge. Left greater than right facet
hypertrophy. No spinal stenosis. Foramina remain patent.

T12-L1: Negative interspace. Bilateral facet hypertrophy. No
stenosis.
IMPRESSION: 1. No acute abnormality within the thoracic spine or spinal cord.
2. Multilevel thoracic spondylosis with small central disc
protrusions at T6-7 and T7-8 without significant stenosis or overt
neural impingement.
3. Mild to moderate bilateral foraminal stenosis at T9-10 and T10-11
related disc bulge and facet hypertrophy as above.

## 2022-01-02 IMAGING — CT CT CERVICAL SPINE W/O CM
3 of 4 series · 12 of 35 positions shown, 14 images · non-contrast
Comparison: None.

CLINICAL DATA: Fall.

EXAM:
CT HEAD WITHOUT CONTRAST
CT CERVICAL SPINE WITHOUT CONTRAST
TECHNIQUE: Multidetector CT imaging of the head and cervical spine was
performed following the standard protocol without intravenous
contrast. Multiplanar CT image reconstructions of the cervical spine
were also generated.

[Series 8: sag bone · sagittal · 0.42mm/px · 5 of 107 slices shown, 6 images]
[im 36/107  bone]
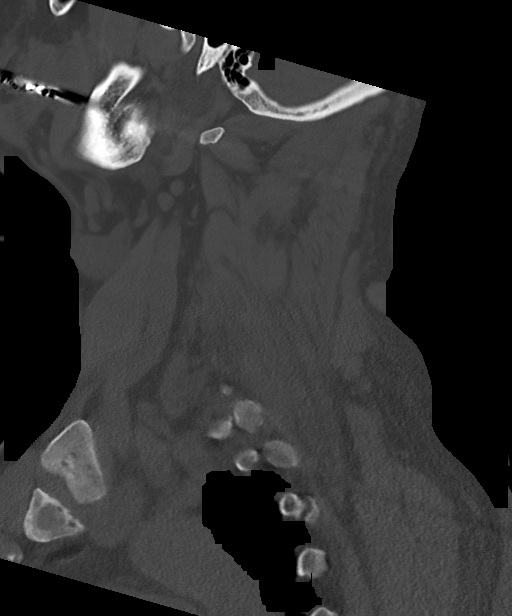
[im 45/107  bone]
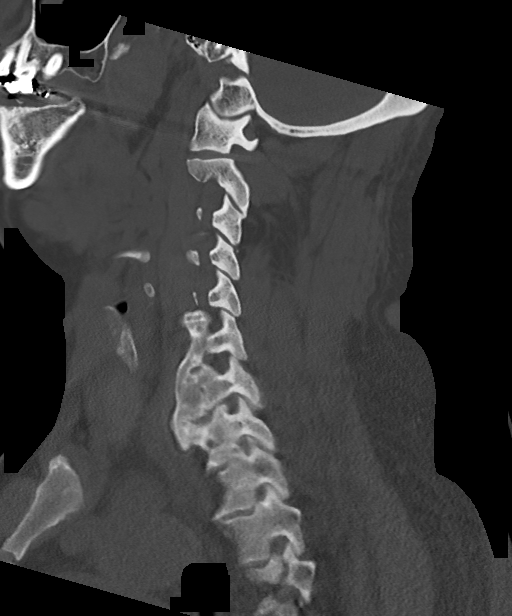
[im 54/107  soft-tissue]
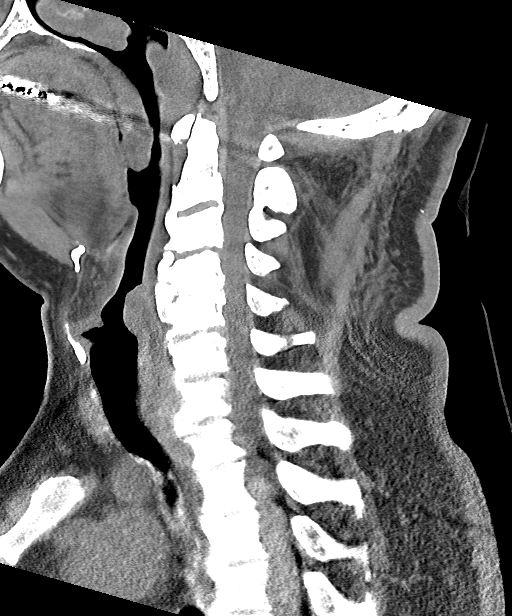
[im 54/107  bone]
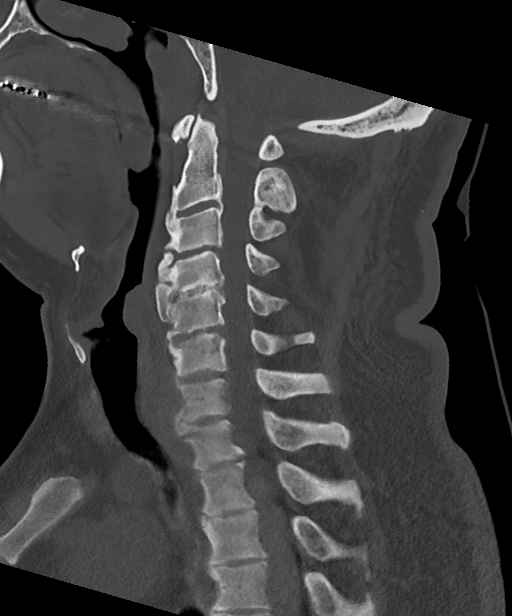
[im 62/107  bone]
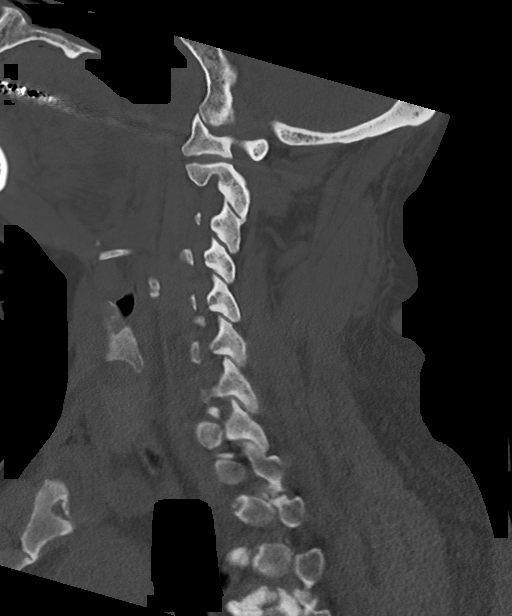
[im 71/107  bone]
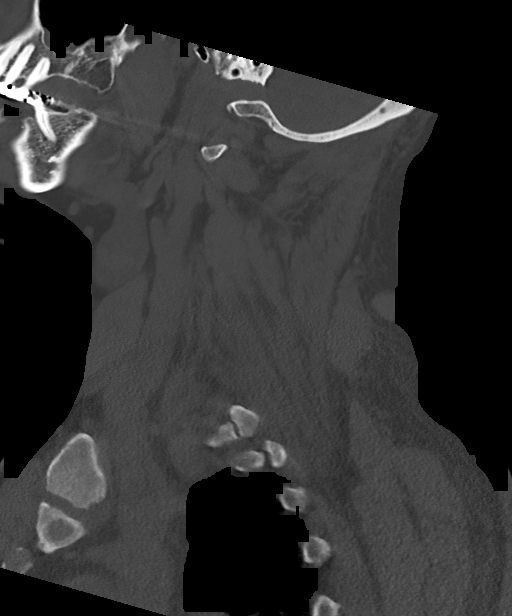

[Series 9: cor bone · coronal · 0.50mm/px · 3 of 157 slices shown]
[im 32/157  bone]
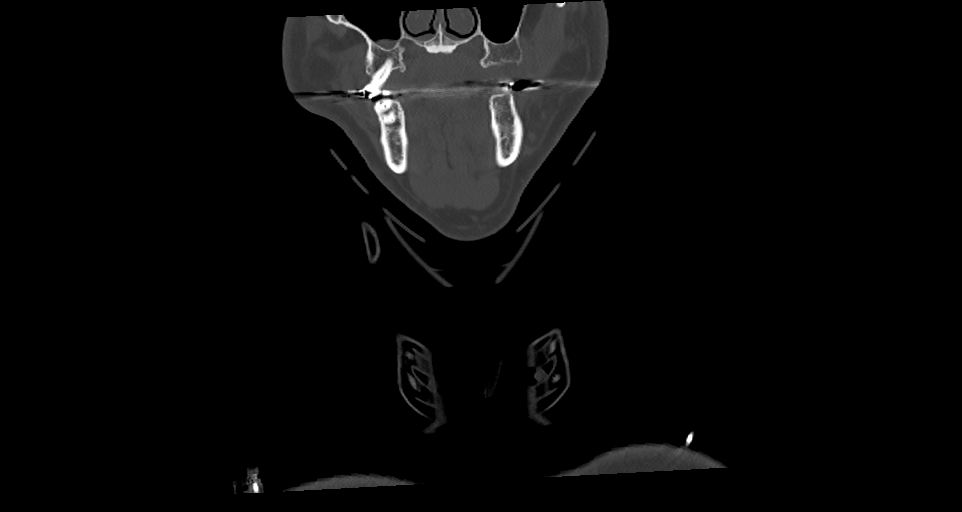
[im 63/157  bone]
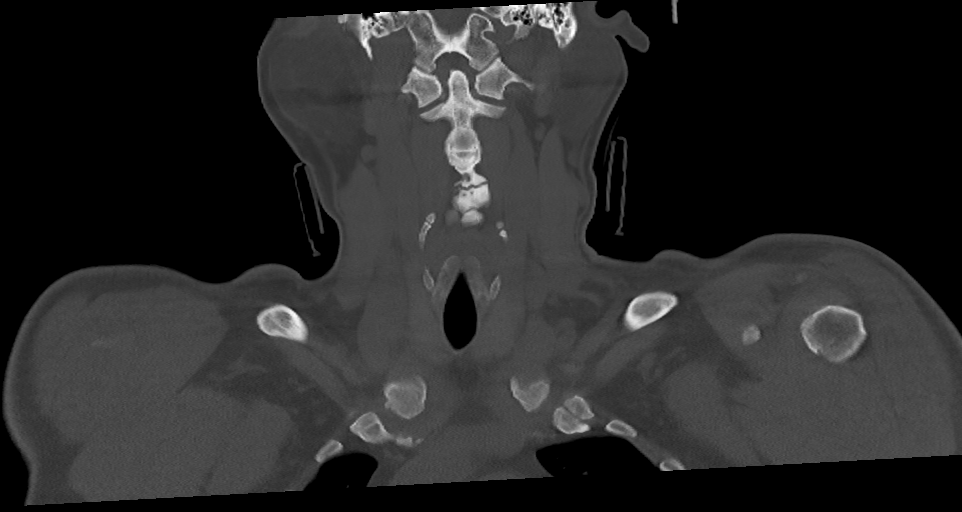
[im 94/157  bone]
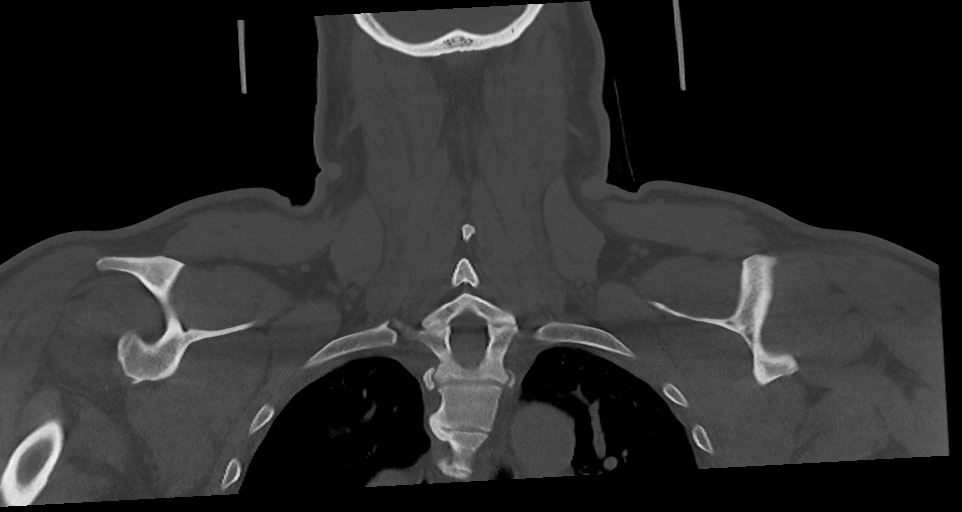

[Series 10: orthogonal axials · axial · 0.21mm/px · z∈[-309,-173]mm · 4 of 121 slices shown, 5 images]
[im 21/121  soft-tissue]
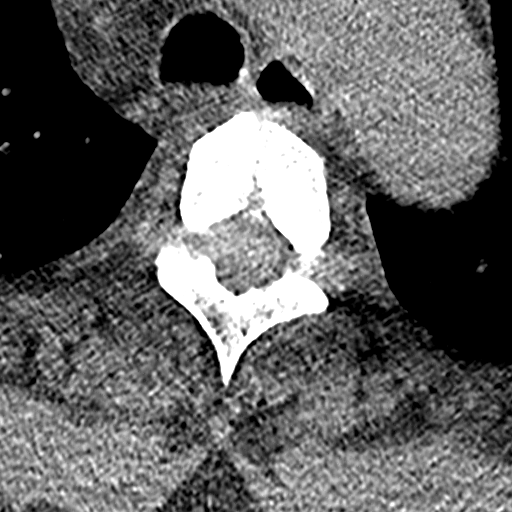
[im 21/121  bone]
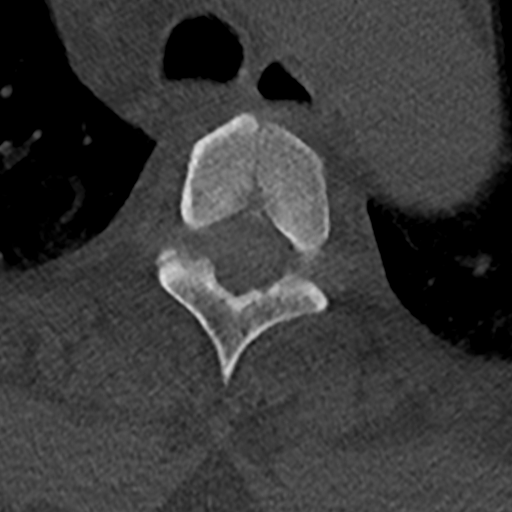
[im 41/121  bone]
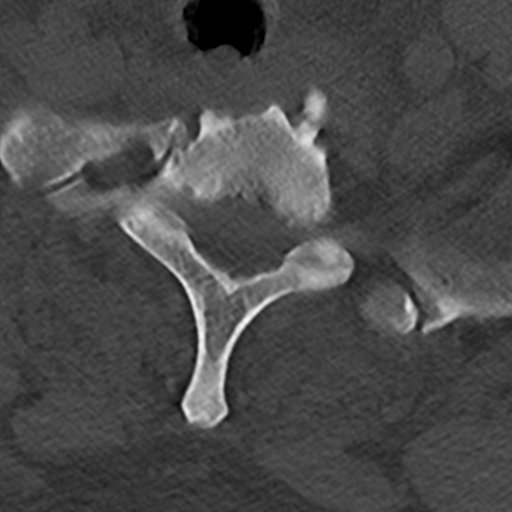
[im 81/121  bone]
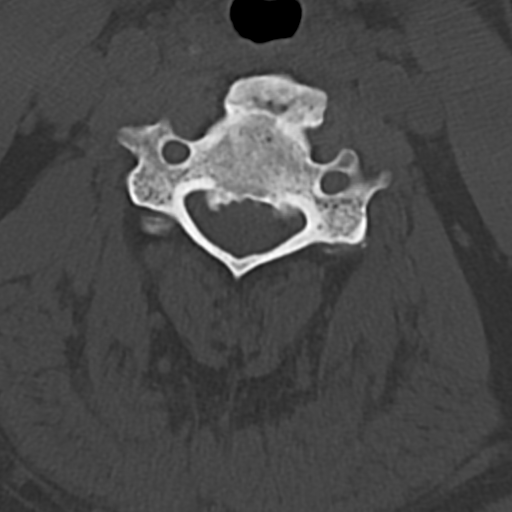
[im 101/121  bone]
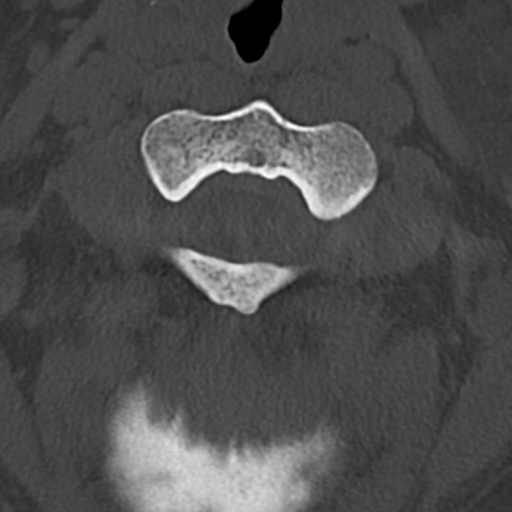

[12 of 35 positions shown; findings below may reference images not displayed]

FINDINGS: CT HEAD FINDINGS

Brain: No evidence of acute infarction, hemorrhage, hydrocephalus,
extra-axial collection or mass lesion/mass effect.

Vascular: No hyperdense vessel or unexpected calcification.

Skull: Normal. Negative for fracture or focal lesion.

Sinuses/Orbits: No acute finding.

Other: Small right posterior scalp hematoma is noted.

CT CERVICAL SPINE FINDINGS

Alignment: Minimal grade 1 retrolisthesis of C4-5 is noted secondary
to severe degenerative disc disease at this level.

Skull base and vertebrae: No acute fracture. No primary bone lesion
or focal pathologic process.

Soft tissues and spinal canal: No prevertebral fluid or swelling. No
visible canal hematoma.

Disc levels: Severe degenerative disc disease is noted at C4-5 and
C5-6. Moderate degenerative disc disease is noted at C3-4. Moderate
to large amount of anterior osteophyte formation is noted at all
levels of the cervical spine.

Upper chest: Negative.

Other: None.
IMPRESSION: Small right posterior scalp hematoma. No acute intracranial
abnormality seen.

Multilevel degenerative disc disease is noted in the cervical spine.
No acute abnormality is noted.

## 2022-01-02 IMAGING — CT CT CHEST-ABD-PELV W/ CM
3 of 5 series · 15 of 46 positions shown, 17 images · IV contrast (omnipaque)
Comparison: None.

CLINICAL DATA: Fall.

EXAM:
CT CHEST, ABDOMEN, AND PELVIS WITH CONTRAST
TECHNIQUE: Multidetector CT imaging of the chest, abdomen and pelvis was
performed following the standard protocol during bolus
administration of intravenous contrast.
CONTRAST:  100mL OMNIPAQUE IOHEXOL 300 MG/ML  SOLN

[Series 3: cap with · axial · 0.81mm/px · z∈[-796,-316]mm · 9 of 121 slices shown, 11 images]
[im 13/121  soft-tissue]
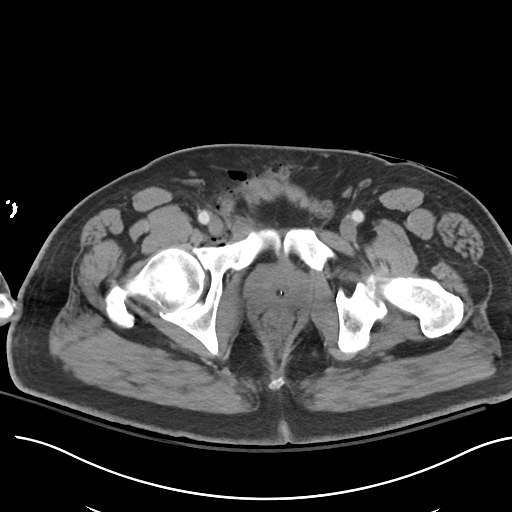
[im 13/121  bone]
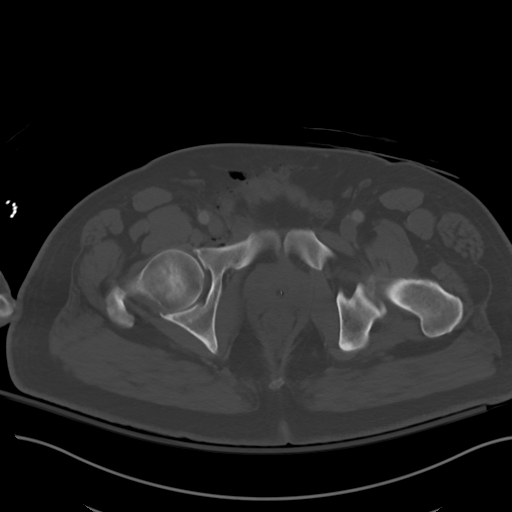
[im 25/121  soft-tissue]
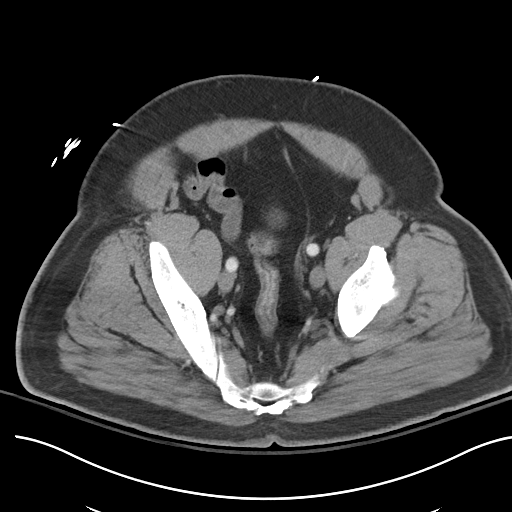
[im 37/121  soft-tissue]
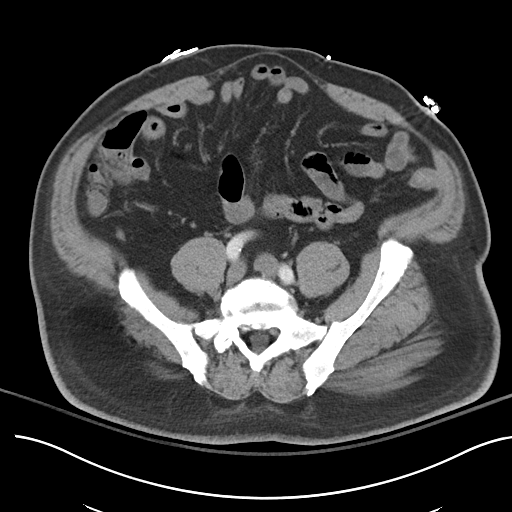
[im 49/121  soft-tissue]
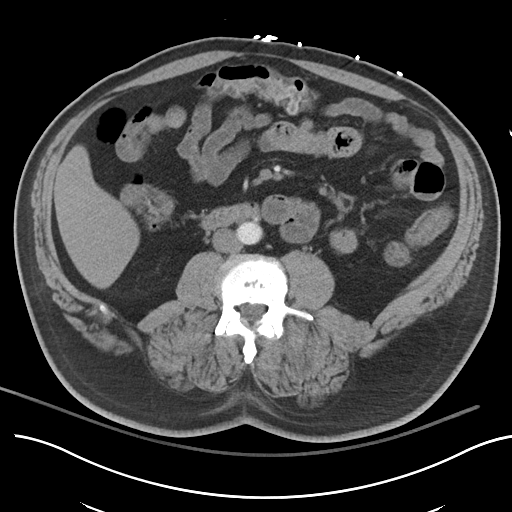
[im 61/121  soft-tissue]
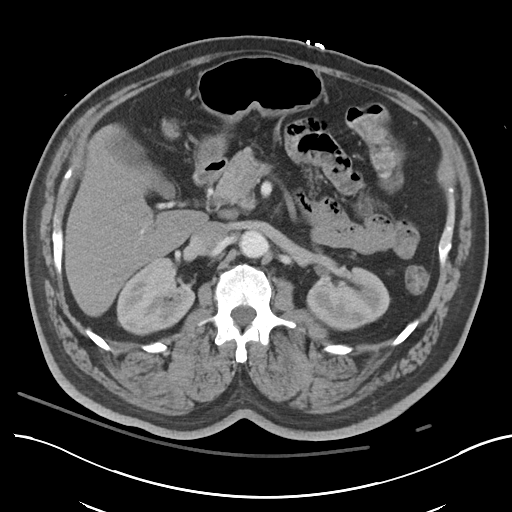
[im 73/121  soft-tissue]
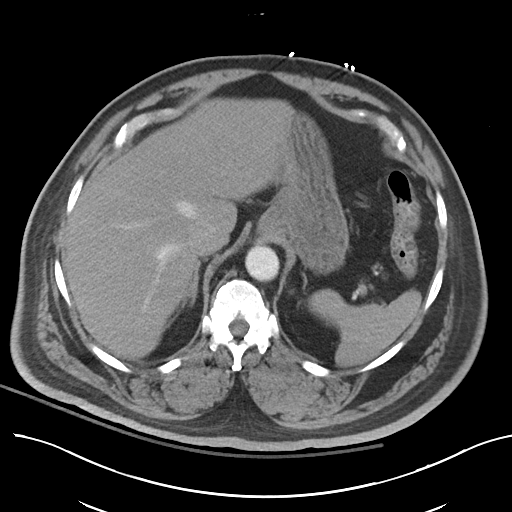
[im 85/121  soft-tissue]
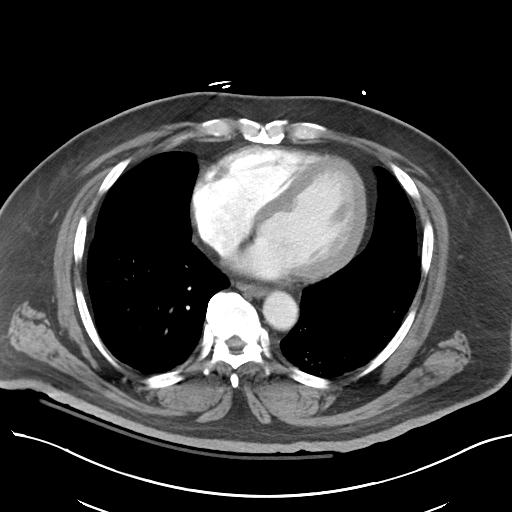
[im 97/121  soft-tissue]
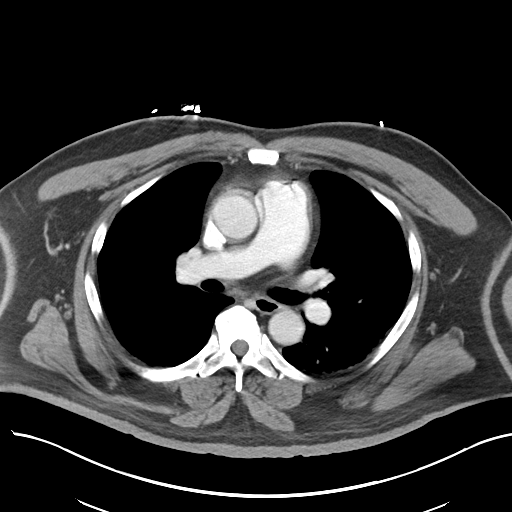
[im 109/121  soft-tissue]
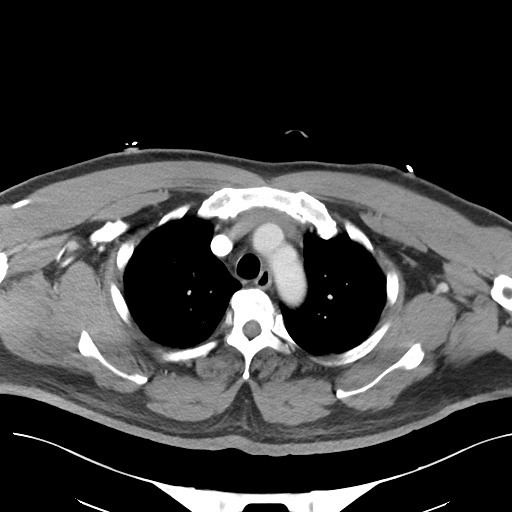
[im 109/121  bone]
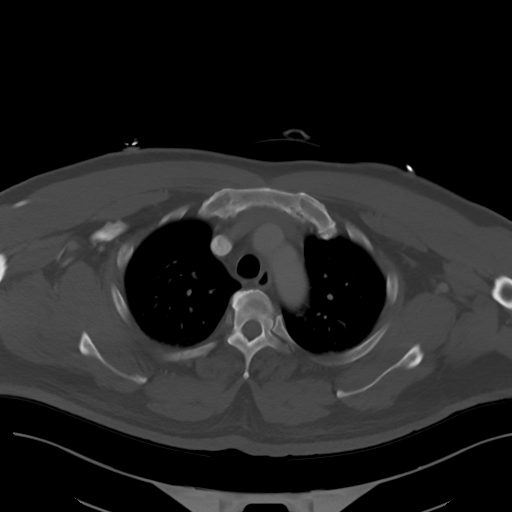

[Series 4: delay · axial · delayed · 0.81mm/px · z∈[-841,-721]mm · 3 of 83 slices shown]
[im 12/83  soft-tissue]
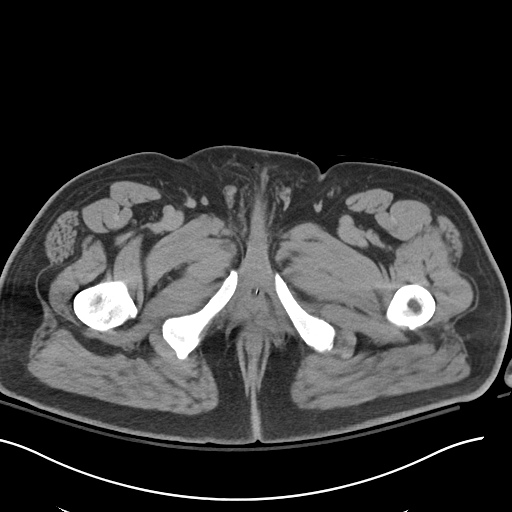
[im 24/83  soft-tissue]
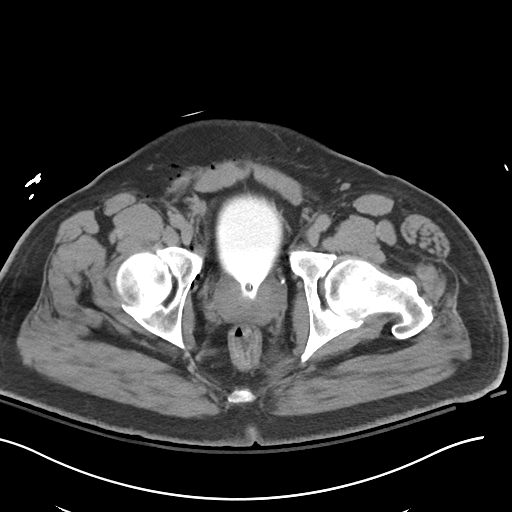
[im 36/83  soft-tissue]
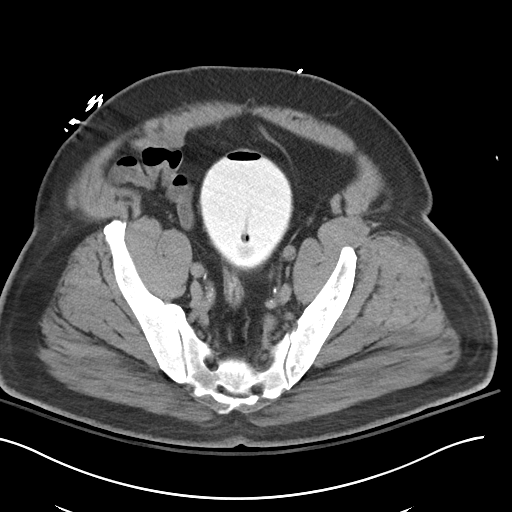

[Series 7: cor · coronal · 0.98mm/px · 3 of 116 slices shown]
[im 39/116  soft-tissue]
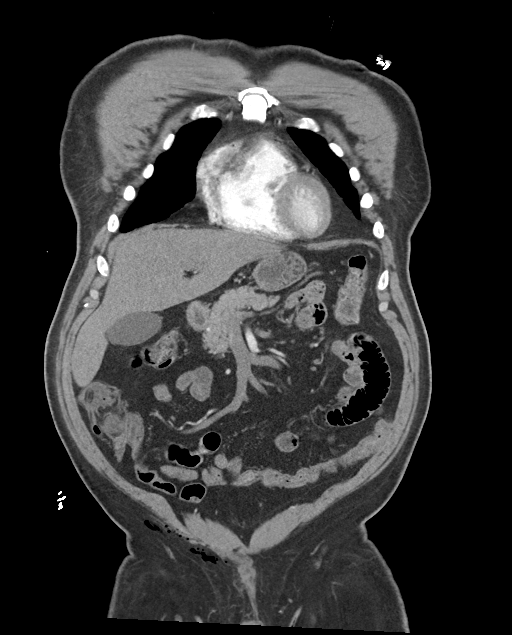
[im 52/116  soft-tissue]
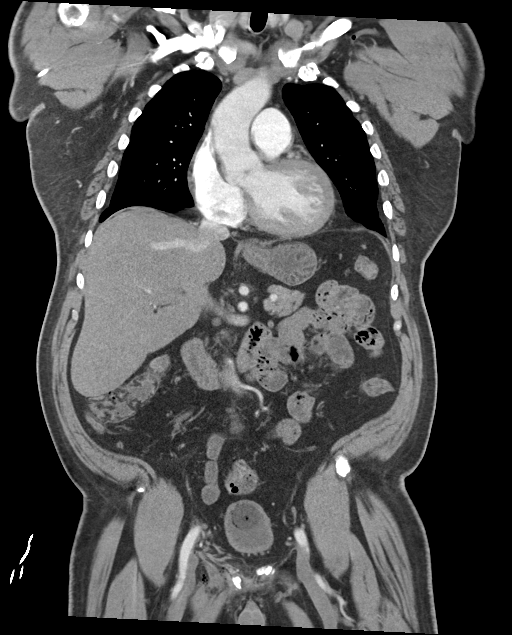
[im 64/116  soft-tissue]
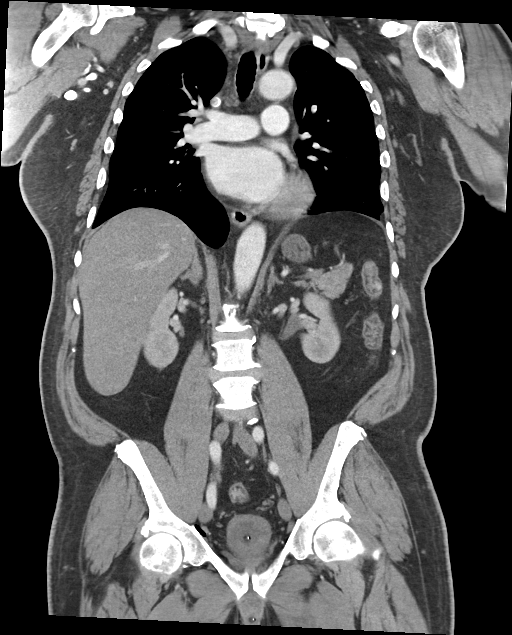

[15 of 46 positions shown; findings below may reference images not displayed]

FINDINGS: CT CHEST FINDINGS

Cardiovascular: No significant vascular findings. Normal heart size.
No pericardial effusion.

Mediastinum/Nodes: No enlarged mediastinal, hilar, or axillary lymph
nodes. Thyroid gland, trachea, and esophagus demonstrate no
significant findings.

Lungs/Pleura: No pneumothorax or pleural effusion is noted. Minimal
left basilar subsegmental atelectasis is noted.

Musculoskeletal: No chest wall mass or suspicious bone lesions
identified.

CT ABDOMEN PELVIS FINDINGS

Hepatobiliary: No focal liver abnormality is seen. No gallstones,
gallbladder wall thickening, or biliary dilatation.

Pancreas: Unremarkable. No pancreatic ductal dilatation or
surrounding inflammatory changes.

Spleen: Normal in size without focal abnormality.

Adrenals/Urinary Tract: Adrenal glands are unremarkable. Kidneys are
normal, without renal calculi, focal lesion, or hydronephrosis.
Bladder is unremarkable. Foley catheter is noted. There is no
evidence of contrast extravasation to suggest bladder injury.

Stomach/Bowel: Stomach is within normal limits. Appendix appears
normal. No evidence of bowel wall thickening, distention, or
inflammatory changes.

Vascular/Lymphatic: No significant vascular findings are present. No
enlarged abdominal or pelvic lymph nodes.

Reproductive: Mild prostatic enlargement is noted. Moderate size
periumbilical hernia is noted which contains loops of small bowel,
but does not result in obstruction.

Other: Large laceration or wound is seen involving the subcutaneous
tissues of the anterior pelvic wall above the base of the penis.
Large amount of gas and small amount of hematoma is seen extending
from this laceration along the anterior abdominal wall in the right
lower quadrant. Gas is also seen extending into the right inguinal
region.

Musculoskeletal: No acute or significant osseous findings.
IMPRESSION: Large laceration or wound is seen involving the subcutaneous tissues
of the anterior pelvic wall at the base of the penis, with gas and
small amount hematoma extending from this laceration along the
anterior abdominal wall in the right lower quadrant and into the
right inguinal region.

No other significant traumatic injury seen in the chest, abdomen or
pelvis.

Mild prostatic enlargement.

These results were discussed at the time of interpretation on
[DATE] at [DATE] with provider Dr. FANTASMA, who verbally
acknowledged these results.

## 2022-01-02 SURGERY — LAPAROTOMY, EXPLORATORY
Anesthesia: General

## 2022-01-02 MED ORDER — CEFAZOLIN SODIUM-DEXTROSE 2-4 GM/100ML-% IV SOLN
2.0000 g | Freq: Once | INTRAVENOUS | Status: AC
  Administered 2022-01-02: 2 g via INTRAVENOUS

## 2022-01-02 MED ORDER — FENTANYL CITRATE (PF) 250 MCG/5ML IJ SOLN
INTRAMUSCULAR | Status: AC
  Filled 2022-01-02: qty 5

## 2022-01-02 MED ORDER — IOHEXOL 300 MG/ML  SOLN
100.0000 mL | Freq: Once | INTRAMUSCULAR | Status: AC | PRN
  Administered 2022-01-02: 100 mL via INTRAVENOUS

## 2022-01-02 MED ORDER — SODIUM CHLORIDE 0.9 % IV SOLN
INTRAVENOUS | Status: AC | PRN
  Administered 2022-01-02: 100 mL/h via INTRAVENOUS

## 2022-01-02 MED ORDER — LIDOCAINE-EPINEPHRINE (PF) 2 %-1:200000 IJ SOLN
10.0000 mL | Freq: Once | INTRAMUSCULAR | Status: AC
  Administered 2022-01-02: 10 mL
  Filled 2022-01-02: qty 20

## 2022-01-02 NOTE — ED Notes (Addendum)
Pt attempted to get out of bed while changing into paper scrubs without tech assistance and fell on the floor. No LOC, and pt did not hit his head. Primary nurse, and MD notified of the fall. Pt placed back into bed, pt at the lowest level and call light within reach. Safety zone submitted.

## 2022-01-02 NOTE — ED Notes (Signed)
Pt returned form MRI.  ?

## 2022-01-02 NOTE — ED Provider Notes (Signed)
..  Laceration Repair  Date/Time: 01/02/2022 5:58 PM Performed by: Jeannie Fend, PA-C Authorized by: Jeannie Fend, PA-C   Consent:    Consent obtained:  Verbal   Consent given by:  Patient   Risks discussed:  Infection, need for additional repair, pain, poor cosmetic result and poor wound healing   Alternatives discussed:  No treatment and delayed treatment Universal protocol:    Procedure explained and questions answered to patient or proxy's satisfaction: yes     Relevant documents present and verified: yes     Test results available: yes     Imaging studies available: yes     Required blood products, implants, devices, and special equipment available: yes     Site/side marked: yes     Immediately prior to procedure, a time out was called: yes     Patient identity confirmed:  Verbally with patient Anesthesia:    Anesthesia method:  Local infiltration   Local anesthetic:  Lidocaine 2% WITH epi Laceration details:    Location:  Trunk   Trunk location: suprapubic area.   Length (cm):  6   Depth (mm):  10 Pre-procedure details:    Preparation:  Patient was prepped and draped in usual sterile fashion Exploration:    Imaging obtained: x-ray     Imaging outcome: foreign body not noted     Wound exploration: wound explored through full range of motion     Wound extent: no foreign bodies/material noted and no underlying fracture noted   Treatment:    Area cleansed with:  Saline   Amount of cleaning:  Extensive   Irrigation solution:  Sterile saline   Irrigation volume:  1L   Irrigation method:  Pressure wash Skin repair:    Repair method:  Staples   Number of staples:  5 Approximation:    Approximation:  Close Repair type:    Repair type:  Simple Post-procedure details:    Dressing:  Bulky dressing   Procedure completion:  Tolerated well, no immediate complications    Jeannie Fend, PA-C 01/02/22 2340    Gwyneth Sprout, MD 01/03/22 1237
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
## 2022-01-02 NOTE — ED Notes (Signed)
Spoke with MRI tech, notifying them pt had staples placed in ED. Per MRI, staples okay for MRI.
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
## 2022-01-02 NOTE — Anesthesia Preprocedure Evaluation (Signed)
Anesthesia Evaluation  Preop documentation limited or incomplete due to emergent nature of procedure.  Airway        Dental   Pulmonary           Cardiovascular      Neuro/Psych    GI/Hepatic   Endo/Other    Renal/GU      Musculoskeletal   Abdominal   Peds  Hematology   Anesthesia Other Findings   Reproductive/Obstetrics                             Anesthesia Physical Anesthesia Plan Anesthesia Quick Evaluation
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
## 2022-01-02 NOTE — ED Notes (Signed)
Pt transported to CT 1 with TRN, delay in second IV start.

## 2022-01-02 NOTE — ED Notes (Signed)
Patient transported to MRI 
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
## 2022-01-03 ENCOUNTER — Encounter (HOSPITAL_COMMUNITY): Payer: Self-pay | Admitting: Neurosurgery

## 2022-01-03 ENCOUNTER — Other Ambulatory Visit: Payer: Self-pay

## 2022-01-03 DIAGNOSIS — Z20822 Contact with and (suspected) exposure to covid-19: Secondary | ICD-10-CM | POA: Diagnosis present

## 2022-01-03 DIAGNOSIS — I10 Essential (primary) hypertension: Secondary | ICD-10-CM | POA: Diagnosis present

## 2022-01-03 DIAGNOSIS — S14123D Central cord syndrome at C3 level of cervical spinal cord, subsequent encounter: Secondary | ICD-10-CM | POA: Diagnosis not present

## 2022-01-03 DIAGNOSIS — Z79899 Other long term (current) drug therapy: Secondary | ICD-10-CM | POA: Diagnosis not present

## 2022-01-03 DIAGNOSIS — K567 Ileus, unspecified: Secondary | ICD-10-CM | POA: Diagnosis not present

## 2022-01-03 DIAGNOSIS — Z8249 Family history of ischemic heart disease and other diseases of the circulatory system: Secondary | ICD-10-CM | POA: Diagnosis not present

## 2022-01-03 DIAGNOSIS — K5981 Ogilvie syndrome: Secondary | ICD-10-CM | POA: Diagnosis not present

## 2022-01-03 DIAGNOSIS — R102 Pelvic and perineal pain: Secondary | ICD-10-CM | POA: Diagnosis present

## 2022-01-03 DIAGNOSIS — S14129S Central cord syndrome at unspecified level of cervical spinal cord, sequela: Secondary | ICD-10-CM | POA: Diagnosis not present

## 2022-01-03 DIAGNOSIS — K5901 Slow transit constipation: Secondary | ICD-10-CM | POA: Diagnosis not present

## 2022-01-03 DIAGNOSIS — S14109A Unspecified injury at unspecified level of cervical spinal cord, initial encounter: Secondary | ICD-10-CM | POA: Diagnosis present

## 2022-01-03 DIAGNOSIS — M2578 Osteophyte, vertebrae: Secondary | ICD-10-CM | POA: Diagnosis present

## 2022-01-03 DIAGNOSIS — Z888 Allergy status to other drugs, medicaments and biological substances status: Secondary | ICD-10-CM | POA: Diagnosis not present

## 2022-01-03 DIAGNOSIS — Z7984 Long term (current) use of oral hypoglycemic drugs: Secondary | ICD-10-CM | POA: Diagnosis not present

## 2022-01-03 DIAGNOSIS — M25511 Pain in right shoulder: Secondary | ICD-10-CM | POA: Diagnosis present

## 2022-01-03 DIAGNOSIS — M4802 Spinal stenosis, cervical region: Secondary | ICD-10-CM | POA: Diagnosis present

## 2022-01-03 DIAGNOSIS — S14123A Central cord syndrome at C3 level of cervical spinal cord, initial encounter: Secondary | ICD-10-CM | POA: Diagnosis not present

## 2022-01-03 DIAGNOSIS — G8191 Hemiplegia, unspecified affecting right dominant side: Secondary | ICD-10-CM | POA: Diagnosis present

## 2022-01-03 DIAGNOSIS — Y9355 Activity, bike riding: Secondary | ICD-10-CM | POA: Diagnosis not present

## 2022-01-03 DIAGNOSIS — Z91040 Latex allergy status: Secondary | ICD-10-CM | POA: Diagnosis not present

## 2022-01-03 DIAGNOSIS — S12690S Other displaced fracture of seventh cervical vertebra, sequela: Secondary | ICD-10-CM | POA: Diagnosis not present

## 2022-01-03 DIAGNOSIS — E669 Obesity, unspecified: Secondary | ICD-10-CM | POA: Diagnosis not present

## 2022-01-03 DIAGNOSIS — M25571 Pain in right ankle and joints of right foot: Secondary | ICD-10-CM | POA: Diagnosis present

## 2022-01-03 DIAGNOSIS — K5900 Constipation, unspecified: Secondary | ICD-10-CM | POA: Diagnosis present

## 2022-01-03 DIAGNOSIS — E876 Hypokalemia: Secondary | ICD-10-CM | POA: Diagnosis present

## 2022-01-03 DIAGNOSIS — K651 Peritoneal abscess: Secondary | ICD-10-CM | POA: Diagnosis not present

## 2022-01-03 DIAGNOSIS — S31010D Laceration without foreign body of lower back and pelvis without penetration into retroperitoneum, subsequent encounter: Secondary | ICD-10-CM | POA: Diagnosis not present

## 2022-01-03 DIAGNOSIS — F32A Depression, unspecified: Secondary | ICD-10-CM | POA: Diagnosis present

## 2022-01-03 DIAGNOSIS — G9520 Unspecified cord compression: Secondary | ICD-10-CM | POA: Diagnosis present

## 2022-01-03 DIAGNOSIS — E1142 Type 2 diabetes mellitus with diabetic polyneuropathy: Secondary | ICD-10-CM | POA: Diagnosis present

## 2022-01-03 DIAGNOSIS — K5939 Other megacolon: Secondary | ICD-10-CM | POA: Diagnosis not present

## 2022-01-03 DIAGNOSIS — M1990 Unspecified osteoarthritis, unspecified site: Secondary | ICD-10-CM | POA: Diagnosis present

## 2022-01-03 DIAGNOSIS — R109 Unspecified abdominal pain: Secondary | ICD-10-CM | POA: Diagnosis not present

## 2022-01-03 DIAGNOSIS — E785 Hyperlipidemia, unspecified: Secondary | ICD-10-CM | POA: Diagnosis present

## 2022-01-03 DIAGNOSIS — D649 Anemia, unspecified: Secondary | ICD-10-CM | POA: Diagnosis not present

## 2022-01-03 DIAGNOSIS — M5030 Other cervical disc degeneration, unspecified cervical region: Secondary | ICD-10-CM | POA: Diagnosis present

## 2022-01-03 DIAGNOSIS — E1169 Type 2 diabetes mellitus with other specified complication: Secondary | ICD-10-CM | POA: Diagnosis not present

## 2022-01-03 LAB — HEMOGLOBIN A1C
Hgb A1c MFr Bld: 6.1 % — ABNORMAL HIGH (ref 4.8–5.6)
Mean Plasma Glucose: 128.37 mg/dL

## 2022-01-03 LAB — CBG MONITORING, ED
Glucose-Capillary: 105 mg/dL — ABNORMAL HIGH (ref 70–99)
Glucose-Capillary: 101 mg/dL — ABNORMAL HIGH (ref 70–99)
Glucose-Capillary: 101 mg/dL — ABNORMAL HIGH (ref 70–99)

## 2022-01-03 LAB — GLUCOSE, CAPILLARY: Glucose-Capillary: 111 mg/dL — ABNORMAL HIGH (ref 70–99)

## 2022-01-03 LAB — HIV ANTIBODY (ROUTINE TESTING W REFLEX): HIV Screen 4th Generation wRfx: NONREACTIVE

## 2022-01-03 LAB — MRSA NEXT GEN BY PCR, NASAL: MRSA by PCR Next Gen: NOT DETECTED

## 2022-01-03 MED ORDER — HEPARIN SODIUM (PORCINE) 5000 UNIT/ML IJ SOLN
5000.0000 [IU] | Freq: Three times a day (TID) | INTRAMUSCULAR | Status: DC
Start: 2022-01-03 — End: 2022-01-05
  Administered 2022-01-03 – 2022-01-05 (×7): 5000 [IU] via SUBCUTANEOUS
  Filled 2022-01-03 (×7): qty 1

## 2022-01-03 MED ORDER — ACETAMINOPHEN 325 MG PO TABS
650.0000 mg | ORAL_TABLET | ORAL | Status: DC | PRN
  Administered 2022-01-03 – 2022-01-05 (×8): 650 mg via ORAL
  Filled 2022-01-03 (×8): qty 2

## 2022-01-03 MED ORDER — ONDANSETRON HCL 4 MG/2ML IJ SOLN: 4.0000 mg | Freq: Four times a day (QID) | INTRAMUSCULAR | Status: DC | PRN

## 2022-01-03 MED ORDER — INSULIN ASPART 100 UNIT/ML IJ SOLN
0.0000 [IU] | Freq: Three times a day (TID) | INTRAMUSCULAR | Status: DC
  Administered 2022-01-04: 3 [IU] via SUBCUTANEOUS
  Administered 2022-01-05 (×2): 2 [IU] via SUBCUTANEOUS

## 2022-01-03 MED ORDER — HYDROCODONE-ACETAMINOPHEN 5-325 MG PO TABS: 2.0000 | ORAL_TABLET | ORAL | Status: DC | PRN

## 2022-01-03 MED ORDER — HYDROCODONE-ACETAMINOPHEN 5-325 MG PO TABS: 1.0000 | ORAL_TABLET | ORAL | Status: DC | PRN

## 2022-01-03 MED ORDER — ONDANSETRON HCL 4 MG PO TABS: 4.0000 mg | ORAL_TABLET | Freq: Four times a day (QID) | ORAL | Status: DC | PRN

## 2022-01-03 NOTE — ED Notes (Signed)
PT at bedside.

## 2022-01-03 NOTE — Progress Notes (Signed)
Inpatient Rehab Admissions Coordinator:   Per therapy recommendations,  patient was screened for CIR candidacy by Megan Salon, MS, CCC-SLP . At this time, Pt. Appears to be a a potential candidate for CIR. I will place   order for rehab consult per protocol for full assessment. Note plans for possible surgical intervention later this week. Please contact me any with questions.  Megan Salon, MS, CCC-SLP Rehab Admissions Coordinator  [phone] (celll) [phone] (office)
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
## 2022-01-03 NOTE — ED Notes (Signed)
[place] applied 

## 2022-01-04 ENCOUNTER — Encounter (HOSPITAL_COMMUNITY): Payer: Self-pay | Admitting: Anesthesiology

## 2022-01-04 LAB — GLUCOSE, CAPILLARY
Glucose-Capillary: 96 mg/dL (ref 70–99)
Glucose-Capillary: 107 mg/dL — ABNORMAL HIGH (ref 70–99)
Glucose-Capillary: 171 mg/dL — ABNORMAL HIGH (ref 70–99)
Glucose-Capillary: 145 mg/dL — ABNORMAL HIGH (ref 70–99)

## 2022-01-04 NOTE — TOC Progression Note (Signed)
Transition of Care [facility]) - Progression Note    Patient Details  Name: Arthur Farrell MRN: [id number] Date of Birth: 1956/09/23  Transition of [facility]) CM/SW Contact  Glennon Mac, RN Phone Number: 01/04/2022, 10:05 AM  Clinical Narrative:    Noted patient with VA benefits.  VA notified by admitting office of admission.   Reference ID: [id number]      Barriers to Discharge: Continued Medical Work up   Quintella Baton, RN, BSN  Trauma/Neuro ICU Case Manager [phone]
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
## 2022-01-04 NOTE — Progress Notes (Signed)
Inpatient Rehabilitation Admissions Coordinator   I met at bedside with patient for rehab assessment. Briefly discussed goals and expectations of a possible Cir admit. He states he lives alone, but has a cousin that has offered her assistance as needed. He would like to discuss with her. I will begin Auth with the [place] and return tomorrow for further discussions and clarifications.  Danne Baxter, RN, MSN Rehab Admissions Coordinator [phone] 01/04/2022 12:18 PM
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
## 2022-01-05 ENCOUNTER — Encounter (HOSPITAL_COMMUNITY): Payer: Self-pay | Admitting: Neurosurgery

## 2022-01-05 ENCOUNTER — Other Ambulatory Visit: Payer: Self-pay

## 2022-01-05 ENCOUNTER — Encounter (HOSPITAL_COMMUNITY): Payer: Self-pay | Admitting: Physical Medicine and Rehabilitation

## 2022-01-05 ENCOUNTER — Inpatient Hospital Stay (HOSPITAL_COMMUNITY)
Admission: RE | Admit: 2022-01-05 | Discharge: 2022-01-15 | DRG: 940 | Disposition: A | Discharge status: Home / Self Care | Payer: No Typology Code available for payment source | Source: Intra-hospital | Attending: Physical Medicine and Rehabilitation | Admitting: Physical Medicine and Rehabilitation

## 2022-01-05 DIAGNOSIS — M1990 Unspecified osteoarthritis, unspecified site: Secondary | ICD-10-CM

## 2022-01-05 DIAGNOSIS — Z4659 Encounter for fitting and adjustment of other gastrointestinal appliance and device: Secondary | ICD-10-CM

## 2022-01-05 DIAGNOSIS — N5089 Other specified disorders of the male genital organs: Secondary | ICD-10-CM | POA: Diagnosis present

## 2022-01-05 DIAGNOSIS — F32A Depression, unspecified: Secondary | ICD-10-CM | POA: Diagnosis present

## 2022-01-05 DIAGNOSIS — E669 Obesity, unspecified: Secondary | ICD-10-CM | POA: Diagnosis present

## 2022-01-05 DIAGNOSIS — Z8261 Family history of arthritis: Secondary | ICD-10-CM | POA: Diagnosis not present

## 2022-01-05 DIAGNOSIS — Z841 Family history of disorders of kidney and ureter: Secondary | ICD-10-CM | POA: Diagnosis not present

## 2022-01-05 DIAGNOSIS — K567 Ileus, unspecified: Secondary | ICD-10-CM | POA: Diagnosis not present

## 2022-01-05 DIAGNOSIS — M4802 Spinal stenosis, cervical region: Secondary | ICD-10-CM | POA: Diagnosis present

## 2022-01-05 DIAGNOSIS — E1169 Type 2 diabetes mellitus with other specified complication: Secondary | ICD-10-CM

## 2022-01-05 DIAGNOSIS — K5901 Slow transit constipation: Secondary | ICD-10-CM

## 2022-01-05 DIAGNOSIS — Z6836 Body mass index (BMI) 36.0-36.9, adult: Secondary | ICD-10-CM | POA: Diagnosis not present

## 2022-01-05 DIAGNOSIS — Z8249 Family history of ischemic heart disease and other diseases of the circulatory system: Secondary | ICD-10-CM

## 2022-01-05 DIAGNOSIS — K5981 Ogilvie syndrome: Secondary | ICD-10-CM | POA: Diagnosis present

## 2022-01-05 DIAGNOSIS — Z7984 Long term (current) use of oral hypoglycemic drugs: Secondary | ICD-10-CM | POA: Diagnosis not present

## 2022-01-05 DIAGNOSIS — S14129S Central cord syndrome at unspecified level of cervical spinal cord, sequela: Secondary | ICD-10-CM | POA: Diagnosis not present

## 2022-01-05 DIAGNOSIS — I10 Essential (primary) hypertension: Secondary | ICD-10-CM | POA: Diagnosis present

## 2022-01-05 DIAGNOSIS — S31010D Laceration without foreign body of lower back and pelvis without penetration into retroperitoneum, subsequent encounter: Secondary | ICD-10-CM

## 2022-01-05 DIAGNOSIS — Y9355 Activity, bike riding: Secondary | ICD-10-CM | POA: Diagnosis not present

## 2022-01-05 DIAGNOSIS — E1142 Type 2 diabetes mellitus with diabetic polyneuropathy: Secondary | ICD-10-CM | POA: Diagnosis present

## 2022-01-05 DIAGNOSIS — S12690S Other displaced fracture of seventh cervical vertebra, sequela: Secondary | ICD-10-CM

## 2022-01-05 DIAGNOSIS — Z823 Family history of stroke: Secondary | ICD-10-CM | POA: Diagnosis not present

## 2022-01-05 DIAGNOSIS — K651 Peritoneal abscess: Secondary | ICD-10-CM | POA: Diagnosis not present

## 2022-01-05 DIAGNOSIS — K5900 Constipation, unspecified: Secondary | ICD-10-CM | POA: Diagnosis present

## 2022-01-05 DIAGNOSIS — G4733 Obstructive sleep apnea (adult) (pediatric): Secondary | ICD-10-CM | POA: Diagnosis present

## 2022-01-05 DIAGNOSIS — D649 Anemia, unspecified: Secondary | ICD-10-CM | POA: Diagnosis present

## 2022-01-05 DIAGNOSIS — E876 Hypokalemia: Secondary | ICD-10-CM | POA: Diagnosis not present

## 2022-01-05 DIAGNOSIS — S14129A Central cord syndrome at unspecified level of cervical spinal cord, initial encounter: Secondary | ICD-10-CM

## 2022-01-05 DIAGNOSIS — R109 Unspecified abdominal pain: Secondary | ICD-10-CM

## 2022-01-05 DIAGNOSIS — Z0189 Encounter for other specified special examinations: Secondary | ICD-10-CM

## 2022-01-05 DIAGNOSIS — Z79899 Other long term (current) drug therapy: Secondary | ICD-10-CM

## 2022-01-05 DIAGNOSIS — S14123D Central cord syndrome at C3 level of cervical spinal cord, subsequent encounter: Secondary | ICD-10-CM | POA: Diagnosis not present

## 2022-01-05 DIAGNOSIS — S14109A Unspecified injury at unspecified level of cervical spinal cord, initial encounter: Secondary | ICD-10-CM

## 2022-01-05 DIAGNOSIS — E785 Hyperlipidemia, unspecified: Secondary | ICD-10-CM | POA: Diagnosis present

## 2022-01-05 DIAGNOSIS — K5939 Other megacolon: Secondary | ICD-10-CM | POA: Diagnosis not present

## 2022-01-05 DIAGNOSIS — R140 Abdominal distension (gaseous): Secondary | ICD-10-CM

## 2022-01-05 DIAGNOSIS — S14123A Central cord syndrome at C3 level of cervical spinal cord, initial encounter: Secondary | ICD-10-CM | POA: Diagnosis present

## 2022-01-05 LAB — GLUCOSE, CAPILLARY
Glucose-Capillary: 126 mg/dL — ABNORMAL HIGH (ref 70–99)
Glucose-Capillary: 122 mg/dL — ABNORMAL HIGH (ref 70–99)
Glucose-Capillary: 118 mg/dL — ABNORMAL HIGH (ref 70–99)
Glucose-Capillary: 112 mg/dL — ABNORMAL HIGH (ref 70–99)

## 2022-01-05 SURGERY — POSTERIOR CERVICAL FUSION/FORAMINOTOMY LEVEL 3
Anesthesia: General

## 2022-01-05 MED ORDER — LISINOPRIL 20 MG PO TABS
40.0000 mg | ORAL_TABLET | Freq: Every day | ORAL | Status: DC
  Administered 2022-01-06 – 2022-01-08 (×3): 40 mg via ORAL
  Filled 2022-01-05 (×3): qty 2

## 2022-01-05 MED ORDER — PROCHLORPERAZINE 25 MG RE SUPP: 12.5000 mg | Freq: Four times a day (QID) | RECTAL | Status: DC | PRN

## 2022-01-05 MED ORDER — METFORMIN HCL ER 750 MG PO TB24
750.0000 mg | ORAL_TABLET | Freq: Two times a day (BID) | ORAL | Status: DC
  Administered 2022-01-05 – 2022-01-08 (×5): 750 mg via ORAL
  Filled 2022-01-05 (×7): qty 1

## 2022-01-05 MED ORDER — AMLODIPINE BESYLATE 10 MG PO TABS
10.0000 mg | ORAL_TABLET | Freq: Every day | ORAL | Status: DC
Start: 2022-01-05 — End: 2022-01-05
  Administered 2022-01-05: 10 mg via ORAL
  Filled 2022-01-05: qty 1

## 2022-01-05 MED ORDER — ATORVASTATIN CALCIUM 40 MG PO TABS
40.0000 mg | ORAL_TABLET | Freq: Every day | ORAL | Status: DC
  Administered 2022-01-06 – 2022-01-08 (×3): 40 mg via ORAL
  Filled 2022-01-05 (×3): qty 1

## 2022-01-05 MED ORDER — LISINOPRIL 20 MG PO TABS
40.0000 mg | ORAL_TABLET | Freq: Every day | ORAL | Status: DC
  Administered 2022-01-05: 40 mg via ORAL
  Filled 2022-01-05: qty 2

## 2022-01-05 MED ORDER — PROCHLORPERAZINE MALEATE 5 MG PO TABS: 5.0000 mg | ORAL_TABLET | Freq: Four times a day (QID) | ORAL | Status: DC | PRN

## 2022-01-05 MED ORDER — TACROLIMUS 0.1 % EX OINT: 1.0000 "application " | TOPICAL_OINTMENT | Freq: Every evening | CUTANEOUS | Status: DC | PRN

## 2022-01-05 MED ORDER — HYDROXYZINE PAMOATE 25 MG PO CAPS: 25.0000 mg | ORAL_CAPSULE | ORAL | Status: DC | PRN

## 2022-01-05 MED ORDER — POLYETHYLENE GLYCOL 3350 17 G PO PACK
17.0000 g | PACK | Freq: Every day | ORAL | Status: DC | PRN
  Administered 2022-01-06 – 2022-01-14 (×2): 17 g via ORAL
  Filled 2022-01-05 (×4): qty 1

## 2022-01-05 MED ORDER — TRIAMCINOLONE ACETONIDE 0.1 % EX OINT: 1.0000 "application " | TOPICAL_OINTMENT | Freq: Two times a day (BID) | CUTANEOUS | Status: DC | PRN

## 2022-01-05 MED ORDER — HYDROXYZINE HCL 25 MG PO TABS
25.0000 mg | ORAL_TABLET | ORAL | Status: DC | PRN
  Administered 2022-01-13 – 2022-01-14 (×2): 25 mg via ORAL
  Filled 2022-01-05 (×2): qty 1

## 2022-01-05 MED ORDER — ALUM & MAG HYDROXIDE-SIMETH 200-200-20 MG/5ML PO SUSP
30.0000 mL | ORAL | Status: DC | PRN
  Administered 2022-01-07 – 2022-01-08 (×2): 30 mL via ORAL
  Filled 2022-01-05 (×3): qty 30

## 2022-01-05 MED ORDER — SORBITOL 70 % SOLN
30.0000 mL | Freq: Every day | Status: DC | PRN
Start: 2022-01-05 — End: 2022-01-08
  Administered 2022-01-07: 30 mL via ORAL
  Filled 2022-01-05: qty 30

## 2022-01-05 MED ORDER — FEXOFENADINE HCL 60 MG PO TABS
60.0000 mg | ORAL_TABLET | Freq: Two times a day (BID) | ORAL | Status: DC
  Administered 2022-01-05 – 2022-01-08 (×7): 60 mg via ORAL
  Filled 2022-01-05 (×9): qty 1

## 2022-01-05 MED ORDER — ACETAMINOPHEN 325 MG PO TABS
650.0000 mg | ORAL_TABLET | ORAL | Status: DC | PRN
  Administered 2022-01-05 – 2022-01-14 (×23): 650 mg via ORAL
  Filled 2022-01-05 (×24): qty 2

## 2022-01-05 MED ORDER — HYDROXYZINE HCL 25 MG PO TABS: 25.0000 mg | ORAL_TABLET | ORAL | Status: DC | PRN

## 2022-01-05 MED ORDER — HYDROCHLOROTHIAZIDE 25 MG PO TABS
25.0000 mg | ORAL_TABLET | Freq: Every day | ORAL | Status: DC
  Administered 2022-01-06 – 2022-01-08 (×3): 25 mg via ORAL
  Filled 2022-01-05 (×3): qty 1

## 2022-01-05 MED ORDER — ATORVASTATIN CALCIUM 40 MG PO TABS
40.0000 mg | ORAL_TABLET | Freq: Every day | ORAL | Status: DC
  Administered 2022-01-05: 40 mg via ORAL
  Filled 2022-01-05: qty 1

## 2022-01-05 MED ORDER — VITAMIN D 25 MCG (1000 UNIT) PO TABS
2000.0000 [IU] | ORAL_TABLET | Freq: Every day | ORAL | Status: DC
  Administered 2022-01-06 – 2022-01-08 (×3): 2000 [IU] via ORAL
  Filled 2022-01-05 (×3): qty 2

## 2022-01-05 MED ORDER — VITAMIN D 25 MCG (1000 UNIT) PO TABS
2000.0000 [IU] | ORAL_TABLET | Freq: Every day | ORAL | Status: DC
  Administered 2022-01-05: 2000 [IU] via ORAL
  Filled 2022-01-05: qty 2

## 2022-01-05 MED ORDER — SORBITOL 70 % SOLN
60.0000 mL | Freq: Once | Status: AC
  Administered 2022-01-06: 60 mL via ORAL
  Filled 2022-01-05: qty 60

## 2022-01-05 MED ORDER — BUPROPION HCL ER (XL) 150 MG PO TB24
150.0000 mg | ORAL_TABLET | Freq: Every day | ORAL | Status: DC
  Administered 2022-01-05: 150 mg via ORAL
  Filled 2022-01-05: qty 1

## 2022-01-05 MED ORDER — PROCHLORPERAZINE EDISYLATE 10 MG/2ML IJ SOLN: 5.0000 mg | Freq: Four times a day (QID) | INTRAMUSCULAR | Status: DC | PRN

## 2022-01-05 MED ORDER — AMLODIPINE BESYLATE 10 MG PO TABS
10.0000 mg | ORAL_TABLET | Freq: Every day | ORAL | Status: DC
  Administered 2022-01-06 – 2022-01-08 (×3): 10 mg via ORAL
  Filled 2022-01-05 (×3): qty 1

## 2022-01-05 MED ORDER — INSULIN ASPART 100 UNIT/ML IJ SOLN
0.0000 [IU] | Freq: Three times a day (TID) | INTRAMUSCULAR | Status: DC
  Administered 2022-01-06: 2 [IU] via SUBCUTANEOUS
  Administered 2022-01-08: 3 [IU] via SUBCUTANEOUS

## 2022-01-05 MED ORDER — BUPROPION HCL ER (XL) 150 MG PO TB24
150.0000 mg | ORAL_TABLET | Freq: Every day | ORAL | Status: DC
  Administered 2022-01-06 – 2022-01-08 (×3): 150 mg via ORAL
  Filled 2022-01-05 (×3): qty 1

## 2022-01-05 MED ORDER — GUAIFENESIN-DM 100-10 MG/5ML PO SYRP: 5.0000 mL | ORAL_SOLUTION | Freq: Four times a day (QID) | ORAL | Status: DC | PRN

## 2022-01-05 MED ORDER — METFORMIN HCL ER 750 MG PO TB24
750.0000 mg | ORAL_TABLET | Freq: Two times a day (BID) | ORAL | Status: DC
  Administered 2022-01-05: 750 mg via ORAL
  Filled 2022-01-05 (×2): qty 1

## 2022-01-05 MED ORDER — HEPARIN SODIUM (PORCINE) 5000 UNIT/ML IJ SOLN
5000.0000 [IU] | Freq: Three times a day (TID) | INTRAMUSCULAR | Status: DC
  Administered 2022-01-05 – 2022-01-15 (×28): 5000 [IU] via SUBCUTANEOUS
  Filled 2022-01-05 (×28): qty 1

## 2022-01-05 MED ORDER — HYDROCHLOROTHIAZIDE 25 MG PO TABS
25.0000 mg | ORAL_TABLET | Freq: Every day | ORAL | Status: DC
Start: 2022-01-05 — End: 2022-01-05
  Administered 2022-01-05: 25 mg via ORAL
  Filled 2022-01-05: qty 1

## 2022-01-05 MED ORDER — HEPARIN SODIUM (PORCINE) 5000 UNIT/ML IJ SOLN: 5000.0000 [IU] | Freq: Three times a day (TID) | INTRAMUSCULAR | Status: DC

## 2022-01-05 MED ORDER — FLEET ENEMA 7-19 GM/118ML RE ENEM: 1.0000 | ENEMA | Freq: Once | RECTAL | Status: DC | PRN
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
## 2022-01-05 NOTE — Progress Notes (Signed)
Inpatient Rehabilitation Admission Medication Review by a Pharmacist  A complete drug regimen review was completed for this patient to identify any potential clinically significant medication issues.  High Risk Drug Classes Is patient taking? Indication by Medication  Antipsychotic Yes Compazine for N/V  Anticoagulant Yes Sq heparin for VTE ppx  Antibiotic No   Opioid No   Antiplatelet No   Hypoglycemics/insulin Yes SSI, metformin for DM  Vasoactive Medication Yes Amlodipine, HCTZ, lisinopril for BP  Chemotherapy No   Other Yes Wellbutrin for mood     Type of Medication Issue Identified Description of Issue Recommendation(s)  Drug Interaction(s) (clinically significant)     Duplicate Therapy     Allergy     No Medication Administration End Date     Incorrect Dose     Additional Drug Therapy Needed     Significant med changes from prior encounter (inform family/care partners about these prior to discharge).    Other       Clinically significant medication issues were identified that warrant physician communication and completion of prescribed/recommended actions by midnight of the next day:  No  Pharmacist comments: None  Time spent performing this drug regimen review (minutes):  20 minutes   Elwin Sleight 01/05/2022 3:28 PM
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
## 2022-01-05 NOTE — Progress Notes (Signed)
Inpatient Rehabilitation Admissions Coordinator   I have insurance approval and CIR bed to admit patient to today. I met with patient at bedside and he is in agreement. I contacted Dr Kathyrn Sheriff in [place] and he is aware and in agreement. I will make the arrangements to admit. Acute team and TOC made aware.  Danne Baxter, RN, MSN Rehab Admissions Coordinator [phone] 01/05/2022 10:57 AM
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
## 2022-01-05 NOTE — TOC CAGE-AID Note (Signed)
Transition of Care [facility]) - CAGE-AID Screening   Patient Details  Name: Arthur Farrell MRN: [id number] Date of Birth: [birth year]  Transition of Care [facility]) CM/SW Contact:    Mance Vallejo C Tarpley-Carter, [place] Phone Number: 01/05/2022, 10:09 AM   Clinical Narrative: Pt participated in [place].  Pt stated he does not use substance or ETOH.  Pt was not offered resources, due to no usage of substance or ETOH.     Keanu Lesniak Tarpley-Carter, MSW, LCSW-A Pronouns:  She/Her/Hers [place] Transitions of Care Clinical Social Worker Direct Number:  [phone] [email or web address]  CAGE-AID Screening: Substance Abuse Screening unable to be completed due to: : Patient unable to participate  Have You Ever Felt You Ought to Cut Down on Your Drinking or Drug Use?: No Have People Annoyed You By [employer] Your Drinking Or Drug Use?: No Have You Felt Bad Or Guilty About Your Drinking Or Drug Use?: No Have You Ever Had a Drink or Used Drugs First Thing In The Morning to Steady Your Nerves or to Get Rid of a Hangover?: No CAGE-AID Score: 0  Substance Abuse Education Offered: No
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
## 2022-01-05 NOTE — Progress Notes (Signed)
Inpatient Rehabilitation  Patient information reviewed and entered into eRehab system by Saron Vanorman M. Leanza Shepperson, M.A., CCC/SLP, PPS Coordinator.  Information including medical coding, functional ability and quality indicators will be reviewed and updated through discharge.    
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
## 2022-01-05 NOTE — Progress Notes (Signed)
Patient concerned about BP increasing. He asked for his home BP med to be added to his morning meds. Will inform day shift RN to discuss with rounding MD.
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
## 2022-01-06 LAB — COMPREHENSIVE METABOLIC PANEL
ALT: 13 U/L (ref 0–44)
AST: 20 U/L (ref 15–41)
Albumin: 3.6 g/dL (ref 3.5–5.0)
Alkaline Phosphatase: 40 U/L (ref 38–126)
Anion gap: 6 (ref 5–15)
BUN: 15 mg/dL (ref 8–23)
CO2: 29 mmol/L (ref 22–32)
Calcium: 9.3 mg/dL (ref 8.9–10.3)
Chloride: 101 mmol/L (ref 98–111)
Creatinine, Ser: 0.98 mg/dL (ref 0.61–1.24)
GFR, Estimated: >60 mL/min (ref >60)
Glucose, Bld: 105 mg/dL — ABNORMAL HIGH (ref 70–99)
Potassium: 3.6 mmol/L (ref 3.5–5.1)
Sodium: 136 mmol/L (ref 135–145)
Total Bilirubin: 0.9 mg/dL (ref 0.3–1.2)
Total Protein: 6.8 g/dL (ref 6.5–8.1)

## 2022-01-06 LAB — GLUCOSE, CAPILLARY
Glucose-Capillary: 123 mg/dL — ABNORMAL HIGH (ref 70–99)
Glucose-Capillary: 110 mg/dL — ABNORMAL HIGH (ref 70–99)
Glucose-Capillary: 82 mg/dL (ref 70–99)
Glucose-Capillary: 92 mg/dL (ref 70–99)

## 2022-01-06 LAB — CBC WITH DIFFERENTIAL/PLATELET
Abs Immature Granulocytes: 0.04 10*3/uL (ref 0.00–0.07)
Basophils Absolute: 0.1 10*3/uL (ref 0.0–0.1)
Basophils Relative: 0 %
Eosinophils Absolute: 0.3 10*3/uL (ref 0.0–0.5)
Eosinophils Relative: 2 %
HCT: 35.7 % — ABNORMAL LOW (ref 39.0–52.0)
Hemoglobin: 11.1 g/dL — ABNORMAL LOW (ref 13.0–17.0)
Immature Granulocytes: 0 %
Lymphocytes Relative: 18 %
Lymphs Abs: 2.1 10*3/uL (ref 0.7–4.0)
MCH: 19.4 pg — ABNORMAL LOW (ref 26.0–34.0)
MCHC: 31.1 g/dL (ref 30.0–36.0)
MCV: 62.3 fL — ABNORMAL LOW (ref 80.0–100.0)
Monocytes Absolute: 1.1 10*3/uL — ABNORMAL HIGH (ref 0.1–1.0)
Monocytes Relative: 10 %
Neutro Abs: 7.8 10*3/uL — ABNORMAL HIGH (ref 1.7–7.7)
Neutrophils Relative %: 70 %
Platelets: 270 10*3/uL (ref 150–400)
RBC: 5.73 MIL/uL (ref 4.22–5.81)
RDW: 17.2 % — ABNORMAL HIGH (ref 11.5–15.5)
WBC: 11.4 10*3/uL — ABNORMAL HIGH (ref 4.0–10.5)
nRBC: 0 % (ref 0.0–0.2)

## 2022-01-06 MED ORDER — TRAMADOL HCL 50 MG PO TABS
50.0000 mg | ORAL_TABLET | Freq: Four times a day (QID) | ORAL | Status: DC | PRN
  Administered 2022-01-06 – 2022-01-15 (×10): 50 mg via ORAL
  Filled 2022-01-06 (×13): qty 1

## 2022-01-06 MED ORDER — SORBITOL 70 % SOLN: 30.0000 mL | Freq: Once | Status: DC

## 2022-01-06 MED ORDER — CEPHALEXIN 250 MG PO CAPS
500.0000 mg | ORAL_CAPSULE | Freq: Three times a day (TID) | ORAL | Status: DC
  Administered 2022-01-06 – 2022-01-08 (×8): 500 mg via ORAL
  Filled 2022-01-06 (×9): qty 2

## 2022-01-06 NOTE — Progress Notes (Signed)
Patient c/o increased pain in pubic area this AM, some swelling is noted with a small amount of serosanguineous drainage is present. Incision staples with some areas not well approximated.
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
## 2022-01-06 NOTE — Progress Notes (Signed)
PROGRESS NOTE   Subjective/Complaints:  Drainage from pubic wound.  Only wants tylenol- it works.   Feels weaker than "normal".  Also penis and testicles swollen.  Also feels sick/nauseated since drainage improved.   Uses CPAP- slept OK.  LBM Monday. Has control of B/B.   ROS:  Pt denies SOB, abd pain, CP, N/V/C/D, and vision changes   Objective:   No results found. Recent Labs    01/06/22 0603  WBC 11.4*  HGB 11.1*  HCT 35.7*  PLT 270   Recent Labs    01/06/22 0603  NA 136  K 3.6  CL 101  CO2 29  GLUCOSE 105*  BUN 15  CREATININE 0.98  CALCIUM 9.3    Intake/Output Summary (Last 24 hours) at 01/06/2022 1505 Last data filed at 01/06/2022 1300 Gross per 24 hour  Intake 120 ml  Output 250 ml  Net -130 ml        Physical Exam: Vital Signs Blood pressure 111/79, pulse 86, temperature 97.8 F (36.6 C), resp. rate 18, height 5\' 9"  (1.753 m), weight 111.2 kg, SpO2 98 %.   General: awake, alert, appropriate, collar in place; NAD HENT: conjugate gaze; oropharynx moist CV: regular rate; no JVD Pulmonary: CTA B/L; no W/R/R- good air movement GI: soft, NT, ND, (+)BS Psychiatric: appropriate Neurological: Ox3 GU_ swelling of penis and testicles Musculoskeletal:        General: No swelling.  Skin:    General: Skin is warm and dry.     Comments: Suprapubic laceration with staples- but has purulent drainage on dressing and looks soupy Neurological:     Mental Status: He is alert.     Comments: Alert and oriented x 3. Normal insight and awareness. Intact Memory. Normal language and speech. Cranial nerve exam unremarkable.  Right MMT:  3+/5 deltoid, 3+/5 bicep, 3+/5 tricep, 3/5 wrist extension, 3- to 3/5 hand intrinsics.      4 to 4+/5 hip flexor, 4/5 knee extension, 4/5 ankle dorsiflexion, 4/5 ankle plantarflexion. Left MMT: . 4 to 4+/5 deltoid, 4 to 4+/5 bicep, 4/5 tricep, 4/5 wrist extension, 4/5 hand  intrinsics.      4+/5 hip flexor, 4+/5 knee extension, 4+/5 ankle dorsiflexion, 4+/5 ankle plantarflexion. Inconsistent sensory finding except for mild loss of LT and prop along pads of feet/toes bilaterally. DTR's 1+, No resting hypertonicity.     Assessment/Plan: 1. Functional deficits which require 3+ hours per day of interdisciplinary therapy in a comprehensive inpatient rehab setting. Physiatrist is providing close team supervision and 24 hour management of active medical problems listed below. Physiatrist and rehab team continue to assess barriers to discharge/monitor patient progress toward functional and medical goals  Care Tool:  Bathing              Bathing assist       Upper Body Dressing/Undressing Upper body dressing        Upper body assist      Lower Body Dressing/Undressing Lower body dressing            Lower body assist       Toileting Toileting    Toileting assist       Transfers  Chair/bed transfer  Transfers assist     Chair/bed transfer assist level: Minimal Assistance - Patient > 75%     Locomotion Ambulation   Ambulation assist      Assist level: Minimal Assistance - Patient > 75% Assistive device: Hand held assist Max distance: 58ft   Walk 10 feet activity   Assist     Assist level: Minimal Assistance - Patient > 75% Assistive device: Hand held assist   Walk 50 feet activity   Assist Walk 50 feet with 2 turns activity did not occur: Safety/medical concerns         Walk 150 feet activity   Assist Walk 150 feet activity did not occur: Safety/medical concerns         Walk 10 feet on uneven surface  activity   Assist Walk 10 feet on uneven surfaces activity did not occur: Safety/medical concerns         Wheelchair     Assist Is the patient using a wheelchair?: Yes      Wheelchair assist level: Minimal Assistance - Patient > 75% Max wheelchair distance: 169ft    Wheelchair 50 feet with  2 turns activity    Assist        Assist Level: Minimal Assistance - Patient > 75%   Wheelchair 150 feet activity     Assist      Assist Level: Moderate Assistance - Patient 50 - 74%   Blood pressure 111/79, pulse 86, temperature 97.8 F (36.6 C), resp. rate 18, height 5\' 9"  (1.753 m), weight 111.2 kg, SpO2 98 %.  Medical Problem List and Plan: 1. 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 non-displaced anterior vertebral body fracture at C7-T1 through osteophyte. Patient may remove c-collar to shower             -ELOS/Goals: 14-20 days, supervision to min assist goals with PT and OT  First day of evaluations- PT and OT- CIR 2.  Antithrombotics: -DVT/anticoagulation:  Pharmaceutical: Heparin             -antiplatelet therapy: none 3. Pain Management: Tylenol prn  1/7- will try Tramadol 50 mg q6 hours prn for pain 4. Mood: Lcsw to evaluate and provide emotional support             -antipsychotic agents: n/a 5. Neuropsych: This patient is capable of making decisions on his own behalf. 6. Skin/Wound Care: Routine skin care checks  1/7- laceration of pubic area- will start Keflex 500 mg q8 hours x 7 days and monitor and elevate scrotum and penis for edema control.  7. 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 1/7- Bgs controlled con't regimen 9. Hypertension: continue Norvasc, HCTZ, Zestril  1/7- BP controlled- cont regimen 10: Hyperlipidemia: continue Lipitor 11: Depression: well controlled on Wellbutrin (home med continued) 12: Constipation: pt has had some flatus, no bm since admit. ?mild  neurogenic component             -sorbitol tomorrow after therapies.  SSE if needed afterwards  1/7- will give sorbitol after therapy today. Has control of stool per pt.  13: Mild hypokalemia:  recheck serum potassium in AM  1/7- K+ 3.6- will recheck Monday   I spent a total of 52 minutes on total care- >50% coordination of care- d/w nursing and prolonged d/w pt and assessment of wounds.   LOS: 1 days A FACE TO FACE EVALUATION WAS PERFORMED  Tiara Bartoli 01/06/2022, 3:05 PM

## 2022-01-06 NOTE — Plan of Care (Signed)
°  Problem: RH Balance Goal: LTG Patient will maintain dynamic sitting balance (PT) Description: LTG:  Patient will maintain dynamic sitting balance with assistance during mobility activities (PT) Flowsheets (Taken 01/06/2022 0909) LTG: Pt will maintain dynamic sitting balance during mobility activities with:: Independent Goal: LTG Patient will maintain dynamic standing balance (PT) Description: LTG:  Patient will maintain dynamic standing balance with assistance during mobility activities (PT) Flowsheets (Taken 01/06/2022 0909) LTG: Pt will maintain dynamic standing balance during mobility activities with:: Independent with assistive device    Problem: RH Bed Mobility Goal: LTG Patient will perform bed mobility with assist (PT) Description: LTG: Patient will perform bed mobility with assistance, with/without cues (PT). Flowsheets (Taken 01/06/2022 0909) LTG: Pt will perform bed mobility with assistance level of: Independent with assistive device    Problem: RH Bed to Chair Transfers Goal: LTG Patient will perform bed/chair transfers w/assist (PT) Description: LTG: Patient will perform bed to chair transfers with assistance (PT). Flowsheets (Taken 01/06/2022 0909) LTG: Pt will perform Bed to Chair Transfers with assistance level: Independent with assistive device    Problem: RH Car Transfers Goal: LTG Patient will perform car transfers with assist (PT) Description: LTG: Patient will perform car transfers with assistance (PT). Flowsheets (Taken 01/06/2022 0909) LTG: Pt will perform car transfers with assist:: Supervision/Verbal cueing   Problem: RH Furniture Transfers Goal: LTG Patient will perform furniture transfers w/assist (OT/PT) Description: LTG: Patient will perform furniture transfers  with assistance (OT/PT). Flowsheets (Taken 01/06/2022 0909) LTG: Pt will perform furniture transfers with assist:: Independent with assistive device    Problem: RH Ambulation Goal: LTG Patient will  ambulate in controlled environment (PT) Description: LTG: Patient will ambulate in a controlled environment, # of feet with assistance (PT). Flowsheets (Taken 01/06/2022 0909) LTG: Pt will ambulate in controlled environ  assist needed:: Independent with assistive device LTG: Ambulation distance in controlled environment: 169ft with LRAD Goal: LTG Patient will ambulate in home environment (PT) Description: LTG: Patient will ambulate in home environment, # of feet with assistance (PT). Flowsheets (Taken 01/06/2022 0909) LTG: Pt will ambulate in home environ  assist needed:: Independent with assistive device LTG: Ambulation distance in home environment: 22ft with LRAD Goal: LTG Patient will ambulate in community environment (PT) Description: LTG: Patient will ambulate in community environment, # of feet with assistance (PT). Flowsheets (Taken 01/06/2022 0909) LTG: Pt will ambulate in community environ  assist needed:: Supervision/Verbal cueing LTG: Ambulation distance in community environment: 139ft with LRAD   Problem: RH Wheelchair Mobility Goal: LTG Patient will propel w/c in controlled environment (PT) Description: LTG: Patient will propel wheelchair in controlled environment, # of feet with assist (PT) Flowsheets (Taken 01/06/2022 0909) LTG: Pt will propel w/c in controlled environ  assist needed:: Supervision/Verbal cueing LTG: Propel w/c distance in controlled environment: 15ft   Problem: RH Stairs Goal: LTG Patient will ambulate up and down stairs w/assist (PT) Description: LTG: Patient will ambulate up and down # of stairs with assistance (PT) Flowsheets (Taken 01/06/2022 0909) LTG: Pt will ambulate up/down stairs assist needed:: Supervision/Verbal cueing LTG: Pt will  ambulate up and down number of stairs: 12 steps to access bed/bath at home with 1 rail

## 2022-01-06 NOTE — Plan of Care (Signed)
Problem: RH Eating Goal: LTG Patient will perform eating w/assist, cues/equip (OT) Description: LTG: Patient will perform eating with assist, with/without cues using equipment (OT) Flowsheets (Taken 01/06/2022 1635) LTG: Pt will perform eating with assistance level of: Independent with assistive device    Problem: RH Grooming Goal: LTG Patient will perform grooming w/assist,cues/equip (OT) Description: LTG: Patient will perform grooming with assist, with/without cues using equipment (OT) Flowsheets (Taken 01/06/2022 1635) LTG: Pt will perform grooming with assistance level of: Independent with assistive device    Problem: RH Bathing Goal: LTG Patient will bathe all body parts with assist levels (OT) Description: LTG: Patient will bathe all body parts with assist levels (OT) Flowsheets (Taken 01/06/2022 1635) LTG: Pt will perform bathing with assistance level/cueing: Independent with assistive device  LTG: Position pt will perform bathing: Shower   Problem: RH Dressing Goal: LTG Patient will perform upper body dressing (OT) Description: LTG Patient will perform upper body dressing with assist, with/without cues (OT). Flowsheets (Taken 01/06/2022 1635) LTG: Pt will perform upper body dressing with assistance level of: Independent with assistive device Goal: LTG Patient will perform lower body dressing w/assist (OT) Description: LTG: Patient will perform lower body dressing with assist, with/without cues in positioning using equipment (OT) Flowsheets (Taken 01/06/2022 1635) LTG: Pt will perform lower body dressing with assistance level of: Independent with assistive device   Problem: RH Toileting Goal: LTG Patient will perform toileting task (3/3 steps) with assistance level (OT) Description: LTG: Patient will perform toileting task (3/3 steps) with assistance level (OT)  Flowsheets (Taken 01/06/2022 1635) LTG: Pt will perform toileting task (3/3 steps) with assistance level: Independent with  assistive device   Problem: RH Functional Use of Upper Extremity Goal: LTG Patient will use RT/LT upper extremity as a (OT) Description: LTG: Patient will use right/left upper extremity as a stabilizer/gross assist/diminished/nondominant/dominant level with assist, with/without cues during functional activity (OT) Flowsheets (Taken 01/06/2022 1635) LTG: Use of upper extremity in functional activities: RUE as dominant level LTG: Pt will use upper extremity in functional activity with assistance level of: Independent   Problem: RH Simple Meal Prep Goal: LTG Patient will perform simple meal prep w/assist (OT) Description: LTG: Patient will perform simple meal prep with assistance, with/without cues (OT). Flowsheets (Taken 01/06/2022 1635) LTG: Pt will perform simple meal prep with assistance level of: Independent with assistive device LTG: Pt will perform simple meal prep w/level of: Ambulate with device   Problem: RH Laundry Goal: LTG Patient will perform laundry w/assist, cues (OT) Description: LTG: Patient will perform laundry with assistance, with/without cues (OT). Flowsheets (Taken 01/06/2022 1635) LTG: Pt will perform laundry with assistance level of: Independent with assistive device LTG: Pt will perform laundry with level of: Ambulate with device   Problem: RH Light Housekeeping Goal: LTG Patient will perform light housekeeping w/assist (OT) Description: LTG: Patient will perform light housekeeping with assistance, with/without cues (OT). Flowsheets (Taken 01/06/2022 1635) LTG: Pt will perform light housekeeping with assistance level of: Independent with assistive device LTG: Pt will perform light housekeeping w/level of: Ambulate with device   Problem: RH Toilet Transfers Goal: LTG Patient will perform toilet transfers w/assist (OT) Description: LTG: Patient will perform toilet transfers with assist, with/without cues using equipment (OT) Flowsheets (Taken 01/06/2022 1635) LTG: Pt will  perform toilet transfers with assistance level of: Independent with assistive device   Problem: RH Tub/Shower Transfers Goal: LTG Patient will perform tub/shower transfers w/assist (OT) Description: LTG: Patient will perform tub/shower transfers with assist, with/without cues using  equipment (OT) Flowsheets (Taken 01/06/2022 1635) LTG: Pt will perform tub/shower stall transfers with assistance level of: Independent with assistive device LTG: Pt will perform tub/shower transfers from: Tub/shower combination
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
## 2022-01-06 NOTE — Progress Notes (Signed)
RT NOTE:  Pt refuses CPAP tonight. Pt states he feels nauseous. Pt will try tomorrow night.

## 2022-01-07 ENCOUNTER — Inpatient Hospital Stay (HOSPITAL_COMMUNITY): Payer: No Typology Code available for payment source

## 2022-01-07 LAB — GLUCOSE, CAPILLARY
Glucose-Capillary: 109 mg/dL — ABNORMAL HIGH (ref 70–99)
Glucose-Capillary: 117 mg/dL — ABNORMAL HIGH (ref 70–99)
Glucose-Capillary: 94 mg/dL (ref 70–99)
Glucose-Capillary: 104 mg/dL — ABNORMAL HIGH (ref 70–99)

## 2022-01-07 IMAGING — CR DG ABDOMEN 1V
2 series · 2 of 2 positions shown · non-contrast
Comparison: None.

CLINICAL DATA: Abdominal discomfort and swelling

EXAM:
ABDOMEN - 1 VIEW

[abdomen kub (1 of 2)]
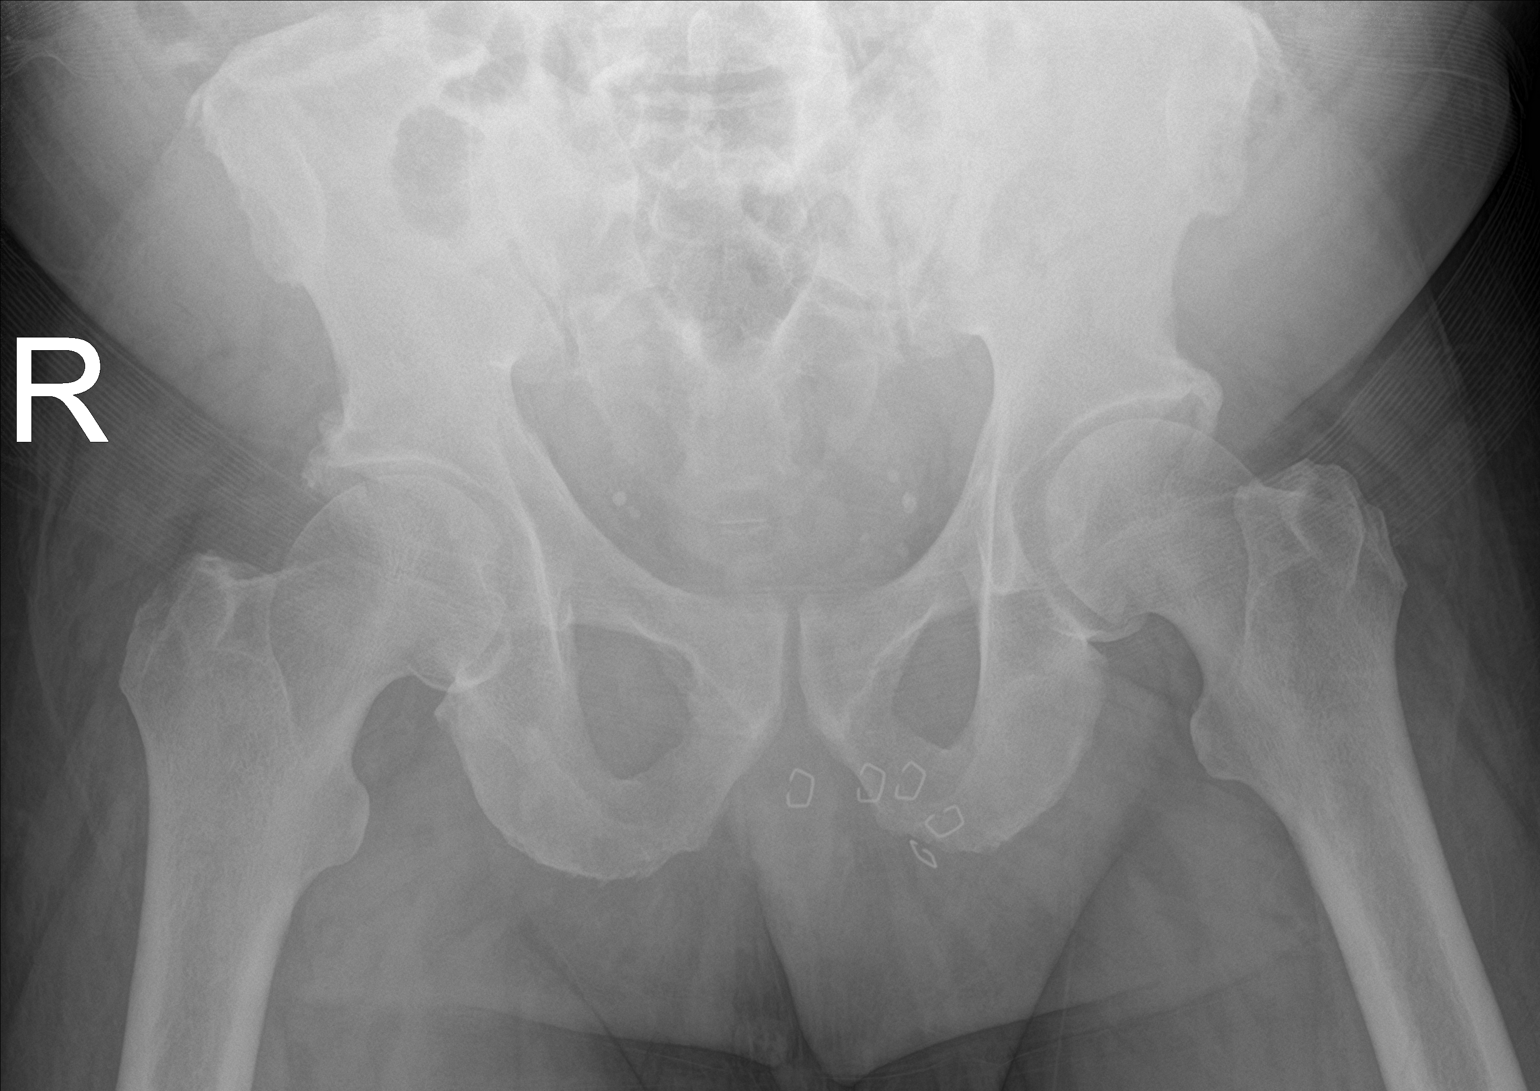

[abdomen kub (2 of 2)]
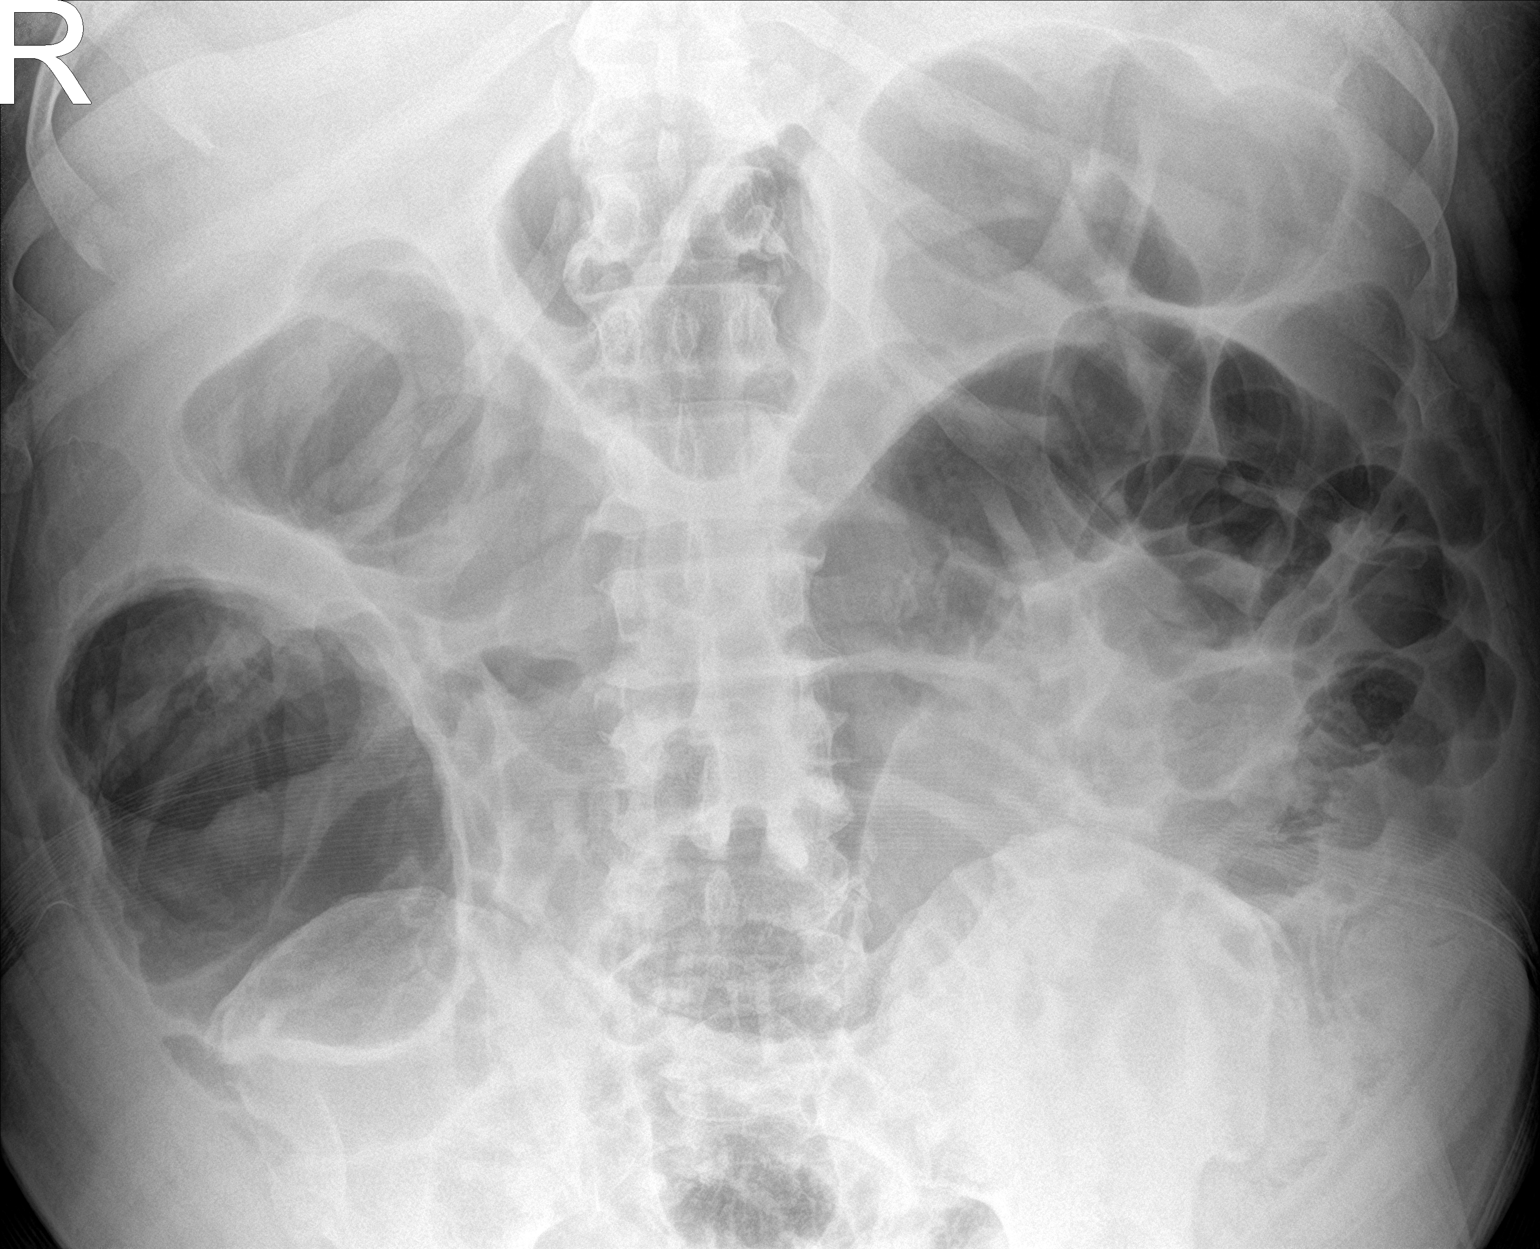

[2 of 2 positions shown; findings below may reference images not displayed]

FINDINGS: Diffusely gas-filled, somewhat distended small bowel and colon,
largest loops of colon measuring up to 9.6 cm. Gas is present to the
rectum. No free air in the abdomen on supine radiographs. Surgical
staples project over the left inferior pubic ramus.
IMPRESSION: Diffusely gas-filled, somewhat distended small bowel and colon,
largest loops of colon measuring up to 9.6 cm. Gas is present to the
rectum. Findings suggest ileus. No obvious free air in the abdomen
on supine radiographs.

## 2022-01-07 MED ORDER — MAGNESIUM HYDROXIDE 400 MG/5ML PO SUSP
30.0000 mL | Freq: Every day | ORAL | Status: DC | PRN
  Filled 2022-01-07: qty 30

## 2022-01-07 MED ORDER — LORAZEPAM 2 MG/ML IJ SOLN
0.5000 mg | Freq: Once | INTRAMUSCULAR | Status: AC
  Administered 2022-01-07: 0.5 mg via INTRAVENOUS
  Filled 2022-01-07: qty 1

## 2022-01-07 NOTE — Progress Notes (Addendum)
PROGRESS NOTE   Subjective/Complaints:  Pt reports feeling fewer chills- feeling better overall- no nausea this AM.  Had BM yesterday with sorbitol and enema- but still feels like needs to go more- wants ot try the Miralax.   Also feels like penile/testicle swelling better.   RUE feels a little stronger this AM  Uses CPAP usually- didn't last night per chart.   ROS:  Pt denies SOB, abd pain, CP, N/V/(+)C/D, and vision changes    Objective:   No results found. Recent Labs    01/06/22 0603  WBC 11.4*  HGB 11.1*  HCT 35.7*  PLT 270   Recent Labs    01/06/22 0603  NA 136  K 3.6  CL 101  CO2 29  GLUCOSE 105*  BUN 15  CREATININE 0.98  CALCIUM 9.3    Intake/Output Summary (Last 24 hours) at 01/07/2022 1037 Last data filed at 01/06/2022 1841 Gross per 24 hour  Intake 360 ml  Output --  Net 360 ml        Physical Exam: Vital Signs Blood pressure 137/80, pulse 78, temperature 98.6 F (37 C), resp. rate 14, height 5\' 9"  (1.753 m), weight 111.2 kg, SpO2 96 %.    General: awake, alert, appropriate, NAD HENT: conjugate gaze; oropharynx moist CV: regular rate; no JVD Pulmonary: CTA B/L; no W/R/R- good air movement GI: soft, NT, distended/protuberant; more normoactive BS Psychiatric: appropriate Neurological: Ox3; no hoffman's B/L  GU_ swelling of penis and testicles- slightly better- is elevated on towel Musculoskeletal:        General: No swelling.  Skin:    General: Skin is warm and dry.     Comments: Suprapubic laceration with staples- but has purulent drainage on dressing and looks much better- no soupiness today Neurological:     Mental Status: He is alert.     Comments: Alert and oriented x 3. Normal insight and awareness. Intact Memory. Normal language and speech. Cranial nerve exam unremarkable.  Right MMT:  3+/5 deltoid, 3+/5 bicep, 3+/5 tricep, 3/5 wrist extension, 3- to 3/5 hand intrinsics.       4 to 4+/5 hip flexor, 4/5 knee extension, 4/5 ankle dorsiflexion, 4/5 ankle plantarflexion. Left MMT: . 4 to 4+/5 deltoid, 4 to 4+/5 bicep, 4/5 tricep, 4/5 wrist extension, 4/5 hand intrinsics.      4+/5 hip flexor, 4+/5 knee extension, 4+/5 ankle dorsiflexion, 4+/5 ankle plantarflexion. Inconsistent sensory finding except for mild loss of LT and prop along pads of feet/toes bilaterally. DTR's 1+, No resting hypertonicity.     Assessment/Plan: 1. Functional deficits which require 3+ hours per day of interdisciplinary therapy in a comprehensive inpatient rehab setting. Physiatrist is providing close team supervision and 24 hour management of active medical problems listed below. Physiatrist and rehab team continue to assess barriers to discharge/monitor patient progress toward functional and medical goals  Care Tool:  Bathing              Bathing assist Assist Level: Minimal Assistance - Patient > 75%     Upper Body Dressing/Undressing Upper body dressing        Upper body assist Assist Level: Minimal Assistance - Patient > 75%  Lower Body Dressing/Undressing Lower body dressing            Lower body assist Assist for lower body dressing: Minimal Assistance - Patient > 75%     Toileting Toileting    Toileting assist Assist for toileting: Minimal Assistance - Patient > 75%     Transfers Chair/bed transfer  Transfers assist     Chair/bed transfer assist level: Minimal Assistance - Patient > 75%     Locomotion Ambulation   Ambulation assist      Assist level: Minimal Assistance - Patient > 75% Assistive device: Hand held assist Max distance: 7010ft   Walk 10 feet activity   Assist     Assist level: Minimal Assistance - Patient > 75% Assistive device: Hand held assist   Walk 50 feet activity   Assist Walk 50 feet with 2 turns activity did not occur: Safety/medical concerns         Walk 150 feet activity   Assist Walk 150 feet activity  did not occur: Safety/medical concerns         Walk 10 feet on uneven surface  activity   Assist Walk 10 feet on uneven surfaces activity did not occur: Safety/medical concerns         Wheelchair     Assist Is the patient using a wheelchair?: Yes      Wheelchair assist level: Minimal Assistance - Patient > 75% Max wheelchair distance: 15900ft    Wheelchair 50 feet with 2 turns activity    Assist        Assist Level: Minimal Assistance - Patient > 75%   Wheelchair 150 feet activity     Assist      Assist Level: Moderate Assistance - Patient 50 - 74%   Blood pressure 137/80, pulse 78, temperature 98.6 F (37 C), resp. rate 14, height 5\' 9"  (1.753 m), weight 111.2 kg, SpO2 96 %.  Medical Problem List and Plan: 1. 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 non-displaced anterior vertebral body fracture at C7-T1 through osteophyte. Patient may remove c-collar to shower             -ELOS/Goals: 14-20 days, supervision to min assist goals with PT and OT  Continue CIR- PT, OT -  2.  Antithrombotics: -DVT/anticoagulation:  Pharmaceutical: Heparin             -antiplatelet therapy: none 3. Pain Management: Tylenol prn  1/7- will try Tramadol 50 mg q6 hours prn for pain 4. Mood: Lcsw to evaluate and provide emotional support             -antipsychotic agents: n/a 5. Neuropsych: This patient is capable of making decisions on his own behalf. 6. Skin/Wound Care: Routine skin care checks  1/7- laceration of pubic area- will start Keflex 500 mg q8 hours x 7 days and monitor and elevate scrotum and penis for edema control.   1/8- Swelling a little better- con't elevation- looks much better- con't Keflex x total of 7 days 7. 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 1/7- Bgs controlled con't regimen 9. Hypertension: continue Norvasc, HCTZ, Zestril  1/7- BP controlled- cont regimen 10: Hyperlipidemia: continue Lipitor 11: Depression: well controlled on Wellbutrin (home med continued) 12: Constipation: pt has had some flatus, no bm since admit. ?mild neurogenic component             -sorbitol tomorrow after therapies.  SSE if needed afterwards  1/7- will give sorbitol after therapy today. Has control of stool per pt.   1/8- Needs to go still, but did have decent sized BM- will let him try Miralax, but might need Sorbitol again tomorrow? 13: Mild hypokalemia: recheck serum potassium in AM  1/7- K+ 3.6- will recheck Monday   Addendum- got KUB due to continued abd pain.  Has significant ileus with 9+ cm dilation of small bowel - d/w pt- placing an NGT for decompression of stomach/SB- on intermittent 125 mmHg suction- also changed diet to clear liquid- might need IVFs if things don't improve rapidly- Also ordered Ativan prn for NGT placement.  Ordered KUB for the AM as well.    LOS: 2 days A FACE TO FACE EVALUATION WAS PERFORMED  Arthur Farrell Lisa 01/07/2022, 10:37 AM

## 2022-01-08 ENCOUNTER — Encounter (HOSPITAL_COMMUNITY)
Admission: RE | Disposition: A | Discharge status: Home / Self Care | Payer: Self-pay | Source: Intra-hospital | Attending: Physical Medicine and Rehabilitation

## 2022-01-08 ENCOUNTER — Encounter (HOSPITAL_COMMUNITY): Payer: Self-pay | Admitting: Physical Medicine and Rehabilitation

## 2022-01-08 ENCOUNTER — Inpatient Hospital Stay (HOSPITAL_COMMUNITY): Payer: No Typology Code available for payment source | Admitting: General Practice

## 2022-01-08 ENCOUNTER — Inpatient Hospital Stay (HOSPITAL_COMMUNITY): Payer: No Typology Code available for payment source

## 2022-01-08 DIAGNOSIS — K5981 Ogilvie syndrome: Secondary | ICD-10-CM

## 2022-01-08 DIAGNOSIS — R109 Unspecified abdominal pain: Secondary | ICD-10-CM

## 2022-01-08 DIAGNOSIS — K5939 Other megacolon: Secondary | ICD-10-CM

## 2022-01-08 HISTORY — PX: COLONOSCOPY WITH PROPOFOL: SHX5780

## 2022-01-08 LAB — BASIC METABOLIC PANEL
Anion gap: 10 (ref 5–15)
BUN: 20 mg/dL (ref 8–23)
CO2: 29 mmol/L (ref 22–32)
Calcium: 9.2 mg/dL (ref 8.9–10.3)
Chloride: 95 mmol/L — ABNORMAL LOW (ref 98–111)
Creatinine, Ser: 1.05 mg/dL (ref 0.61–1.24)
GFR, Estimated: >60 mL/min (ref >60)
Glucose, Bld: 112 mg/dL — ABNORMAL HIGH (ref 70–99)
Potassium: 3.4 mmol/L — ABNORMAL LOW (ref 3.5–5.1)
Sodium: 134 mmol/L — ABNORMAL LOW (ref 135–145)

## 2022-01-08 LAB — FOLATE: Folate: 11.4 ng/mL (ref >5.9)

## 2022-01-08 LAB — IRON AND TIBC
Iron: 32 ug/dL — ABNORMAL LOW (ref 45–182)
Saturation Ratios: 9 % — ABNORMAL LOW (ref 17.9–39.5)
TIBC: 370 ug/dL (ref 250–450)
UIBC: 338 ug/dL

## 2022-01-08 LAB — RETICULOCYTES
Immature Retic Fract: 35.5 % — ABNORMAL HIGH (ref 2.3–15.9)
RBC.: 6.28 MIL/uL — ABNORMAL HIGH (ref 4.22–5.81)
Retic Count, Absolute: 147 10*3/uL (ref 19.0–186.0)
Retic Ct Pct: 2.3 % (ref 0.4–3.1)

## 2022-01-08 LAB — GLUCOSE, CAPILLARY
Glucose-Capillary: 116 mg/dL — ABNORMAL HIGH (ref 70–99)
Glucose-Capillary: 159 mg/dL — ABNORMAL HIGH (ref 70–99)
Glucose-Capillary: 117 mg/dL — ABNORMAL HIGH (ref 70–99)
Glucose-Capillary: 114 mg/dL — ABNORMAL HIGH (ref 70–99)
Glucose-Capillary: 104 mg/dL — ABNORMAL HIGH (ref 70–99)
Glucose-Capillary: 104 mg/dL — ABNORMAL HIGH (ref 70–99)
Glucose-Capillary: 98 mg/dL (ref 70–99)

## 2022-01-08 LAB — PHOSPHORUS: Phosphorus: 4.1 mg/dL (ref 2.5–4.6)

## 2022-01-08 LAB — MAGNESIUM: Magnesium: 2.6 mg/dL — ABNORMAL HIGH (ref 1.7–2.4)

## 2022-01-08 LAB — FERRITIN: Ferritin: 185 ng/mL (ref 24–336)

## 2022-01-08 LAB — VITAMIN B12: Vitamin B-12: 187 pg/mL (ref 180–914)

## 2022-01-08 IMAGING — DX DG ABD PORTABLE 1V
1 series · 1 of 1 positions shown · non-contrast
Comparison: [DATE] and earlier, including CT Abdomen and Pelvis
[DATE].

CLINICAL DATA: 65-year-old male with abdominal pain.

EXAM:
PORTABLE ABDOMEN - 1 VIEW

[abdomen kub]
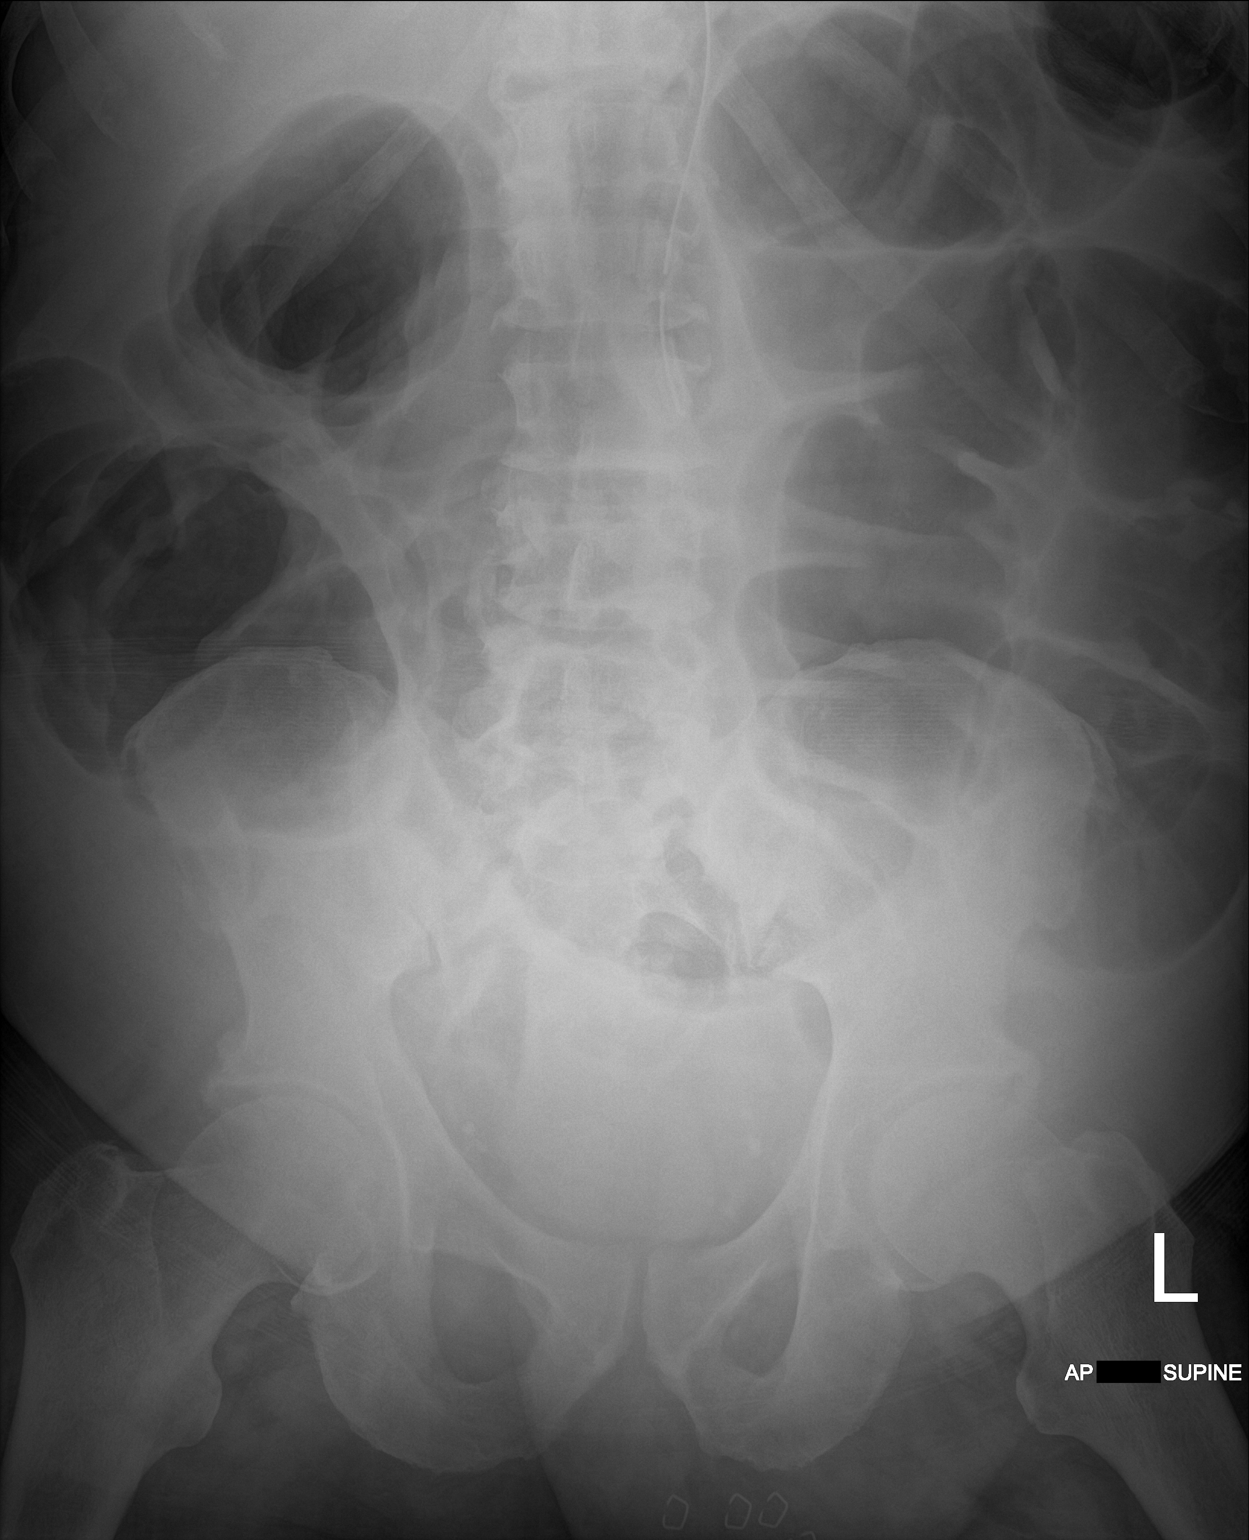

[1 of 1 positions shown; findings below may reference images not displayed]

FINDINGS: Portable AP supine view at [I2] hours. Enteric tube remains in place
with side hole at the level of the gastric body. Diffuse gaseous
distension of the colon on going. But no dilated small bowel loops.
Paucity of distal colonic gas, none in the pelvis now. No
pneumoperitoneum evident on this supine view. Pelvic phleboliths. No
acute osseous abnormality identified.
IMPRESSION: 1. Stable enteric tube.
2. Gas distended large bowel to the distal descending or sigmoid.
Decreased small bowel gas since [DATE]. Given absence of an
obstructing distal large bowel lesion on the recent CT favor this is
colonic ileus.

## 2022-01-08 SURGERY — COLONOSCOPY WITH PROPOFOL
Anesthesia: General

## 2022-01-08 MED ORDER — KCL IN DEXTROSE-NACL 20-5-0.45 MEQ/L-%-% IV SOLN
INTRAVENOUS | Status: DC
  Administered 2022-01-10: 1000 mL via INTRAVENOUS
  Filled 2022-01-08 (×7): qty 1000

## 2022-01-08 MED ORDER — SODIUM CHLORIDE 0.9 % IV SOLN: INTRAVENOUS | Status: DC

## 2022-01-08 MED ORDER — PROPOFOL 10 MG/ML IV BOLUS
INTRAVENOUS | Status: DC | PRN
  Administered 2022-01-08 (×2): 20 mg via INTRAVENOUS
  Administered 2022-01-08: 10 mg via INTRAVENOUS
  Administered 2022-01-08: 20 mg via INTRAVENOUS
  Administered 2022-01-08: 50 mg via INTRAVENOUS

## 2022-01-08 MED ORDER — ATORVASTATIN CALCIUM 40 MG PO TABS
40.0000 mg | ORAL_TABLET | Freq: Every day | ORAL | Status: DC
  Administered 2022-01-09 – 2022-01-10 (×2): 40 mg
  Filled 2022-01-08 (×2): qty 1

## 2022-01-08 MED ORDER — METFORMIN HCL ER 750 MG PO TB24: 750.0000 mg | ORAL_TABLET | Freq: Two times a day (BID) | ORAL | Status: DC

## 2022-01-08 MED ORDER — SODIUM CHLORIDE 0.9 % IV BOLUS
1000.0000 mL | Freq: Once | INTRAVENOUS | Status: AC
  Administered 2022-01-08: 1000 mL via INTRAVENOUS

## 2022-01-08 MED ORDER — CEPHALEXIN 250 MG PO CAPS
500.0000 mg | ORAL_CAPSULE | Freq: Three times a day (TID) | ORAL | Status: DC
  Administered 2022-01-09 – 2022-01-10 (×4): 500 mg
  Filled 2022-01-08 (×4): qty 2

## 2022-01-08 MED ORDER — FEXOFENADINE HCL 60 MG PO TABS
60.0000 mg | ORAL_TABLET | Freq: Two times a day (BID) | ORAL | Status: DC
  Administered 2022-01-09 – 2022-01-10 (×3): 60 mg
  Filled 2022-01-08 (×4): qty 1

## 2022-01-08 MED ORDER — LIDOCAINE 2% (20 MG/ML) 5 ML SYRINGE
INTRAMUSCULAR | Status: DC | PRN
  Administered 2022-01-08: 40 mg via INTRAVENOUS

## 2022-01-08 MED ORDER — POTASSIUM CHLORIDE 10 MEQ/100ML IV SOLN
10.0000 meq | INTRAVENOUS | Status: AC
  Administered 2022-01-08 (×2): 10 meq via INTRAVENOUS
  Filled 2022-01-08 (×2): qty 100

## 2022-01-08 MED ORDER — PHENYLEPHRINE 40 MCG/ML (10ML) SYRINGE FOR IV PUSH (FOR BLOOD PRESSURE SUPPORT)
PREFILLED_SYRINGE | INTRAVENOUS | Status: DC | PRN
  Administered 2022-01-08 (×4): 80 ug via INTRAVENOUS

## 2022-01-08 MED ORDER — INSULIN ASPART 100 UNIT/ML IJ SOLN
0.0000 [IU] | Freq: Four times a day (QID) | INTRAMUSCULAR | Status: DC
  Administered 2022-01-10: 2 [IU] via SUBCUTANEOUS

## 2022-01-08 MED ORDER — HYDROCHLOROTHIAZIDE 25 MG PO TABS
25.0000 mg | ORAL_TABLET | Freq: Every day | ORAL | Status: DC
  Administered 2022-01-09 – 2022-01-10 (×2): 25 mg
  Filled 2022-01-08 (×2): qty 1

## 2022-01-08 MED ORDER — BUPROPION HCL 75 MG PO TABS
75.0000 mg | ORAL_TABLET | Freq: Two times a day (BID) | ORAL | Status: DC
  Administered 2022-01-09 – 2022-01-10 (×3): 75 mg
  Filled 2022-01-08 (×4): qty 1

## 2022-01-08 MED ORDER — LISINOPRIL 20 MG PO TABS
40.0000 mg | ORAL_TABLET | Freq: Every day | ORAL | Status: DC
  Administered 2022-01-09 – 2022-01-10 (×2): 40 mg
  Filled 2022-01-08 (×2): qty 2

## 2022-01-08 MED ORDER — VITAMIN D 25 MCG (1000 UNIT) PO TABS
2000.0000 [IU] | ORAL_TABLET | Freq: Every day | ORAL | Status: DC
  Administered 2022-01-09 – 2022-01-10 (×2): 2000 [IU]
  Filled 2022-01-08 (×2): qty 2

## 2022-01-08 SURGICAL SUPPLY — 22 items

## 2022-01-08 NOTE — Anesthesia Procedure Notes (Signed)
Procedure Name: MAC Date/Time: 01/08/2022 3:27 PM Performed by: Dorann Lodge, CRNA Pre-anesthesia Checklist: Patient identified, Emergency Drugs available, Suction available, Patient being monitored and Timeout performed Patient Re-evaluated:Patient Re-evaluated prior to induction Oxygen Delivery Method: Nasal cannula Induction Type: IV induction

## 2022-01-08 NOTE — Progress Notes (Signed)
Occupational Therapy Session Note  Patient Details  Name: Arthur Farrell MRN: [id number] Date of Birth: June 18, 1956  Today's Date: 01/08/2022 OT Individual Time: 2229-7989 OT Individual Time Calculation (min): 9 min  and Today's Date: 01/08/2022 OT Missed Time: 66 Minutes Missed Time Reason: Patient ill (comment)   Short Term Goals: Week 1:  OT Short Term Goal 1 (Week 1): STGs=LTGs due to ELOS   Skilled Therapeutic Interventions/Progress Updates:    Pt greeted at time of OT session sitting up in recliner resting with NG tube in place with clear liquids sign on door. Pt stating he has a ileus/SBO and is feeling "terrible." Offered pt several seated options for FMC/UB strength, endurance, energy conservation, etc but pt politely declined. Pt agreeable to try to participate in OT session later today if able. Missed 66 mins of OT.   Therapy Documentation Precautions:  Precautions Precautions: Fall, Cervical Required Braces or Orthoses: Cervical Brace Cervical Brace: Hard collar, At all times (except for shower) Restrictions Weight Bearing Restrictions: No    Therapy/Group: Individual Therapy  Erasmo Score 01/08/2022, 7:16 AM

## 2022-01-08 NOTE — Op Note (Signed)
[facility] Patient Name: Arthur Farrell Procedure Date : 01/08/2022 MRN: [id number] Attending MD: Arthur Fiedler , MD Date of Birth: 11-26-1956 CSN: [id number] Age: 66 Admit Type: Inpatient Procedure:                Colonoscopy Indications:              Generalized abdominal pain, Abnormal abdominal                            x-ray of the GI tract, For therapy of Ogilvie's                            syndrome Providers:                Carie Caddy. Rhea Belton, MD, Adolph Pollack, RN, Alan Ripper                            Technician, Technician Referring MD:             Cristopher Peru. Lovorn Medicines:                Monitored Anesthesia Care (propofol only) Complications:            No immediate complications. Estimated Blood Loss:     Estimated blood loss: none. Procedure:                Pre-Anesthesia Assessment:                           - Prior to the procedure, a History and Physical                            was performed, and patient medications and                            allergies were reviewed. The patient's tolerance of                            previous anesthesia was also reviewed. The risks                            and benefits of the procedure and the sedation                            options and risks were discussed with the patient.                            All questions were answered, and informed consent                            was obtained. Prior Anticoagulants: The patient has                            taken no previous anticoagulant or antiplatelet  agents. ASA Grade Assessment: III - A patient with                            severe systemic disease. After reviewing the risks                            and benefits, the patient was deemed in                            satisfactory condition to undergo the procedure.                           After obtaining informed consent, the colonoscope                            was passed  under direct vision. Throughout the                            procedure, the patient's blood pressure, pulse, and                            oxygen saturations were monitored continuously. The                            [id number]-HQ190TL ([id number]) Olympus peds colonoscope was                            introduced through the anus and advanced to the                            hepatic flexure for evaluation and decompression.                            Scope not advanced to cecum due to limited                            visibility and having accomplished decompression.                            The colonoscopy was performed without difficulty.                            The patient tolerated the procedure well. The                            quality of the bowel preparation was unprepped. Scope In: 3:33:23 PM Scope Out: 3:47:03 PM Total Procedure Duration: 0 hours 13 minutes 40 seconds  Findings:      The digital rectal exam was normal.      The lumen of the sigmoid colon, descending colon and transverse colon       was moderately dilated. There was thick liquid stool present in part of       the colon which limited visualization. The visible colonic mucosa was       normal  in appearance without ischemic changes. Air was removed       throughout the procedure with visible decompression. Copious irrigation       and lavage performed.      Retroflexion in the rectum was not performed due to unprepped colon. Impression:               - Dilation consistent with ileus in the sigmoid                            colon, in the descending colon and in the                            transverse colon. No mass lesion seen. Exam only to                            hepatic flexure. Decompression via colonoscope.                           - No specimens collected. Moderate Sedation:      N/A Recommendation:           - Return patient to hospital ward for ongoing care.                           - Continue  NG tube decompression.                           - IV fluids initiated starting with 1L fluid bolus                            and then D5-1/2 NS with 20 meq K given NPO status                            and NG tube decompression (NPO state). Going                            forward will need maintenance hydration and                            replacement of fluid lost via NGT.                           - Correct electrolytes.                           - Repeat 2v abd x-ray in the AM                           - Ambulate as much as possible.                           - Avoid narcotics and anti-cholinergic medications.                            Consider holding amlodipine as CCB is a  smooth-muscle relaxer and may worsen ileus.                           - Begin MiraLax when able. Consider repeat enema if                            needed.                           - If not improving consider CT abd/pelvis.                           - We will see him tomorrow. 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 All rights reserved. The codes documented in this report are preliminary and upon coder review may  be revised to meet current compliance requirements. Arthur FiedlerJay M Leilyn Frayre, MD 01/08/2022 4:03:26 PM This report has been signed electronically. Number of Addenda: 0

## 2022-01-08 NOTE — Anesthesia Preprocedure Evaluation (Signed)
Anesthesia Evaluation  Patient identified by MRN, date of birth, ID band Patient awake    Reviewed: Allergy & Precautions, NPO status , Patient's Chart, lab work & pertinent test results  Airway Mallampati: II  TM Distance: >3 FB    Comment: Patient in a C Collar. Case discussed with Dr. Rhea Belton regarding postioning and in agreement. Dr. Chilton Si Dental   Pulmonary sleep apnea ,    breath sounds clear to auscultation       Cardiovascular hypertension,  Rhythm:Regular Rate:Normal     Neuro/Psych PSYCHIATRIC DISORDERS Trauma history noted Dr. Chilton Si    GI/Hepatic Neg liver ROS, History noted Dr. Chilton Si   Endo/Other  diabetes  Renal/GU      Musculoskeletal  (+) Arthritis ,   Abdominal   Peds  Hematology   Anesthesia Other Findings   Reproductive/Obstetrics                             Anesthesia Physical Anesthesia Plan  ASA: 3  Anesthesia Plan: General   Post-op Pain Management:    Induction: Intravenous  PONV Risk Score and Plan: Ondansetron, Treatment may vary due to age or medical condition and Propofol infusion  Airway Management Planned: Simple Face Mask  Additional Equipment:   Intra-op Plan:   Post-operative Plan:   Informed Consent: I have reviewed the patients History and Physical, chart, labs and discussed the procedure including the risks, benefits and alternatives for the proposed anesthesia with the patient or authorized representative who has indicated his/her understanding and acceptance.     Dental advisory given  Plan Discussed with: CRNA, Anesthesiologist and Surgeon  Anesthesia Plan Comments:         Anesthesia Quick Evaluation

## 2022-01-08 NOTE — Care Management (Signed)
Inpatient Rehabilitation Center Individual Statement of Services  Patient Name:  Page Lancon  Date:  01/08/2022  Welcome to the Inpatient Rehabilitation Center.  Our goal is to provide you with an individualized program based on your diagnosis and situation, designed to meet your specific needs.  With this comprehensive rehabilitation program, you will be expected to participate in at least 3 hours of rehabilitation therapies Monday-Friday, with modified therapy programming on the weekends.  Your rehabilitation program will include the following services:  Physical Therapy (PT), Occupational Therapy (OT), 24 hour per day rehabilitation nursing, Therapeutic Recreaction (TR), Psychology, Neuropsychology, Care Coordinator, Rehabilitation Medicine, Nutrition Services, Pharmacy Services, and Other  Weekly team conferences will be held on Tuesdays to discuss your progress.  Your Inpatient Rehabilitation Care Coordinator will talk with you frequently to get your input and to update you on team discussions.  Team conferences with you and your family in attendance may also be held.  Expected length of stay: 6-9 days  Overall anticipated outcome: Independent with Assistive Device  Depending on your progress and recovery, your program may change. Your Inpatient Rehabilitation Care Coordinator will coordinate services and will keep you informed of any changes. Your Inpatient Rehabilitation Care Coordinator's name and contact numbers are listed  below.  The following services may also be recommended but are not provided by the Inpatient Rehabilitation Center:  Driving Evaluations Home Health Rehabiltiation Services Outpatient Rehabilitation Services Vocational Rehabilitation   Arrangements will be made to provide these services after discharge if needed.  Arrangements include referral to agencies that provide these services.  Your insurance has been verified to be:  IT consultant  primary doctor is:  Almetta Lovely  Pertinent information will be shared with your doctor and your insurance company.  Inpatient Rehabilitation Care Coordinator:  Susie Cassette [phone] or ([phone]  Information discussed with and copy given to patient by: Gretchen Short, 01/08/2022, 11:04 AM

## 2022-01-08 NOTE — IPOC Note (Signed)
Overall Plan of Care [facility]) Patient Details Name: Arthur Farrell MRN: [id number] DOB: 10/09/1956  Admitting Diagnosis: Central cord syndrome [facility])  Hospital Problems: Principal Problem:   Central cord syndrome [facility])     Functional Problem List: Nursing Bowel, Endurance, Edema, Medication Management, Pain, Safety, Sensory  PT Balance, Behavior, Edema, Endurance, Motor, Pain, Sensory, Skin Integrity  OT Balance, Motor, Pain, Endurance  SLP    TR         Basic ADLs: OT Grooming, Bathing, Dressing, Toileting, Eating     Advanced  ADLs: OT Simple Meal Preparation, Laundry, Light Housekeeping     Transfers: PT Bed Mobility, Bed to Chair, Car, [employer], Floor  OT Toilet, Tub/Shower     Locomotion: PT Ambulation, Psychologist, prison and probation services, Stairs     Additional Impairments: OT Fuctional Use of Upper Extremity  SLP        TR      Anticipated Outcomes Item Anticipated Outcome  Self Feeding mod I  Swallowing      Basic self-care  mod I  Toileting  mod I   Bathroom Transfers mod I  Bowel/Bladder  min assist  Transfers  Mod I with LRAD  Locomotion  Mod I with RW at house hold distance.  Communication     Cognition     Pain  < 3  Safety/Judgment  min assist and no falls   Therapy Plan: PT Intensity: Minimum of 1-2 x/day ,45 to 90 minutes PT Frequency: 5 out of 7 days PT Duration Estimated Length of Stay: 6-9 days OT Intensity: Minimum of 1-2 x/day, 45 to 90 minutes OT Frequency: 5 out of 7 days OT Duration/Estimated Length of Stay: 6-9 days     Due to the current state of emergency, patients may not be receiving their 3-hours of Medicare-mandated therapy.   Team Interventions: Nursing Interventions Patient/Family Education, Bowel Management, Pain Management, Medication Management, Discharge Planning  PT interventions Balance/vestibular training, Community reintegration, Ambulation/gait training, DME/adaptive equipment instruction, Psychosocial support,  Neuromuscular re-education, Stair training, Wheelchair propulsion/positioning, UE/LE Strength taining/ROM, Cognitive remediation/compensation, Discharge planning, Disease management/prevention, Functional mobility training, Functional electrical stimulation, Patient/family education, Skin care/wound management, Pain management, Splinting/orthotics, Therapeutic Exercise, Therapeutic Activities, UE/LE Coordination activities, Visual/perceptual remediation/compensation  OT Interventions Balance/vestibular training, Discharge planning, Pain management, Self Care/advanced ADL retraining, Therapeutic Activities, UE/LE Coordination activities, Disease mangement/prevention, Functional mobility training, Patient/family education, Therapeutic Exercise, Community reintegration, Fish farm manager, Neuromuscular re-education, Psychosocial support, UE/LE Strength taining/ROM  SLP Interventions    TR Interventions    SW/CM Interventions Discharge Planning, Psychosocial Support, Patient/Family Education   Barriers to Discharge MD  Medical stability  Nursing Decreased caregiver support, Home environment access/layout, Incontinence, Lack of/limited family support, Weight, Weight bearing restrictions, Medication compliance, Inaccessible home environment, Other (comments) (Stairs) Lives in 2 level apartment with 7 steps to enter and both rails available. Bedroom/bathroom upstairs, 1/2 bath on main level. Full flight of stairs to 2nd level. Family and friends will be available to assist at discharge.  PT Inaccessible home environment, Decreased caregiver support, Home environment access/layout, Wound Care, Lack of/limited family support, Insurance for SNF coverage, Weight    OT      SLP      SW       Team Discharge Planning: Destination: PT-Home ,OT- Home , SLP-  Projected Follow-up: PT-Home health PT, OT-  Outpatient OT, SLP-  Projected Equipment Needs: PT-Rolling walker with 5" wheels, Cane, OT-  To be determined, SLP-  Equipment Details: PT- , OT-  Patient/family involved in discharge planning:  PT- Patient,  OT-Patient, SLP-   MD ELOS: 14-20 days Medical Rehab Prognosis:  Excellent Assessment: Arthur Farrell is a 66 year old man admitted to CIR with functional deficits secondary to cervical cord contusion at C3-C5, presenting as a central cord syndrome,  right more affected than left, 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 Medications are being managed, and labs and vitals are being monitored regularly.     See Team Conference Notes for weekly updates to the plan of care

## 2022-01-08 NOTE — Anesthesia Postprocedure Evaluation (Signed)
Anesthesia Post Note  Patient: Arthur Farrell  Procedure(s) Performed: COLONOSCOPY WITH PROPOFOL     Patient location during evaluation: Endoscopy Anesthesia Type: General Level of consciousness: awake Pain management: pain level controlled Vital Signs Assessment: post-procedure vital signs reviewed and stable Respiratory status: spontaneous breathing Cardiovascular status: stable Postop Assessment: no apparent nausea or vomiting Anesthetic complications: no   No notable events documented.  Last Vitals:  Vitals:   01/08/22 1600 01/08/22 1603  BP: 101/69 108/64  Pulse: 73 79  Resp: 15 16  Temp:    SpO2: 96% 95%    Last Pain:  Vitals:   01/08/22 1553  TempSrc: Temporal  PainSc: 0-No pain                 Henriette Hesser

## 2022-01-08 NOTE — Progress Notes (Signed)
Physical Therapy Session Note  Patient Details  Name: Arthur Farrell MRN: [id number] Date of Birth: October 13, 1956  Today's Date: 01/08/2022 PT Individual Time: 1100-1123 PT Individual Time Calculation (min): 23 min   Short Term Goals: Week 1:  PT Short Term Goal 1 (Week 1): STG=LTG due to ELOS  Skilled Therapeutic Interventions/Progress Updates:    Session 1: Pt received seated in bed, reports he is not feeling well this AM due to feeling very "weak" and fatigued. Pt requesting his BG be assessed due to feeling "off". Nursing notified and able to assess BG, 115 mg/dL. PA also in room to assess pt's ongoing abdominal issues. Discussed pt's PLOF, CLOF prior to onset of these symptoms. Pt declines to sit up to EOB or any OOB mobility this date due to not feeling well from abdominal issues. Pt missed 37 min of scheduled therapy session due to fatigue and illness. Pt left seated in bed with needs in reach. Pt also requesting a soft cervical collar vs hard collar, PA notified.  Session 2: Attempted to see patient for scheduled PM session, pt reports he is being taken off the floor for colonoscopy. Pt missed 60 min of scheduled therapy session for procedure.  Therapy Documentation Precautions:  Precautions Precautions: Fall, Cervical Required Braces or Orthoses: Cervical Brace Cervical Brace: Hard collar, At all times (except for shower) Restrictions Weight Bearing Restrictions: No General: PT Amount of Missed Time (min): 37 Minutes; 60 min PT Missed Treatment Reason: Patient ill (Comment) (fatigue, abdominal pain); off floor for colonoscopy      Therapy/Group: Individual Therapy   Peter Congo, PT, DPT, CSRS  01/08/2022, 12:14 PM

## 2022-01-08 NOTE — Progress Notes (Signed)
Patient had NG tube placed today. Unable to place CPAP mask over tube with proper seal. Patient also declined CPAP with NG tube at this time. Patient currently on RA. Spo2 96%. RN at bedside. Aware to call for RT if needed.
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
## 2022-01-08 NOTE — Progress Notes (Signed)
PROGRESS NOTE   Subjective/Complaints: Feeling overall unwell but with no specific complaints Asks about the results of his KUB He has had chronic abdominal complaints- [facility]   ROS:  Pt denies SOB, abd pain, CP, N/V/(+)C/D, and vision changes, +testicular swelling    Objective:   DG Abd 1 View  Result Date: 01/07/2022 CLINICAL DATA:  NG tube placement EXAM: ABDOMEN - 1 VIEW COMPARISON:  01/07/2022 FINDINGS: NG tube is in the stomach. Dilated bowel loops again noted, similar prior study. No organomegaly or free air. IMPRESSION: NG tube in the stomach. No change in the diffuse bowel distention Electronically Signed   By: Charlett NoseKevin  Dover M.D.   On: 01/07/2022 22:32   DG Abd 1 View  Result Date: 01/07/2022 CLINICAL DATA:  Abdominal discomfort and swelling EXAM: ABDOMEN - 1 VIEW COMPARISON:  None. FINDINGS: Diffusely gas-filled, somewhat distended small bowel and colon, largest loops of colon measuring up to 9.6 cm. Gas is present to the rectum. No free air in the abdomen on supine radiographs. Surgical staples project over the left inferior pubic ramus. IMPRESSION: Diffusely gas-filled, somewhat distended small bowel and colon, largest loops of colon measuring up to 9.6 cm. Gas is present to the rectum. Findings suggest ileus. No obvious free air in the abdomen on supine radiographs. Electronically Signed   By: Jearld LeschAlex D Bibbey M.D.   On: 01/07/2022 16:21   DG Abd Portable 1V  Result Date: 01/08/2022 CLINICAL DATA:  66 year old male with abdominal pain. EXAM: PORTABLE ABDOMEN - 1 VIEW COMPARISON:  01/07/2022 and earlier, including CT Abdomen and Pelvis 01/02/2022. FINDINGS: Portable AP supine view at 0502 hours. Enteric tube remains in place with side hole at the level of the gastric body. Diffuse gaseous distension of the colon on going. But no dilated small bowel loops. Paucity of distal colonic gas, none in the  pelvis now. No pneumoperitoneum evident on this supine view. Pelvic phleboliths. No acute osseous abnormality identified. IMPRESSION: 1. Stable enteric tube. 2. Gas distended large bowel to the distal descending or sigmoid. Decreased small bowel gas since 01/07/2022. Given absence of an obstructing distal large bowel lesion on the recent CT favor this is colonic ileus. Electronically Signed   By: Odessa FlemingH  Hall M.D.   On: 01/08/2022 07:00   Recent Labs    01/06/22 0603  WBC 11.4*  HGB 11.1*  HCT 35.7*  PLT 270   Recent Labs    01/06/22 0603  NA 136  K 3.6  CL 101  CO2 29  GLUCOSE 105*  BUN 15  CREATININE 0.98  CALCIUM 9.3    Intake/Output Summary (Last 24 hours) at 01/08/2022 1100 Last data filed at 01/08/2022 [id number] Gross per 24 hour  Intake 0 ml  Output 1100 ml  Net -1100 ml        Physical Exam: Vital Signs Blood pressure 136/85, pulse 87, temperature 99.4 F (37.4 C), resp. rate 18, height 5\' 9"  (1.753 m), weight 111.2 kg, SpO2 (!) 88 %. Gen: no distress, normal appearing HEENT: oral mucosa pink and moist, NCAT Cardio: Reg rate Chest: normal effort, normal rate of breathing Abd: soft, non-distended Ext: no edema Psych:  pleasant, normal affect Neurological: Ox3; no hoffman's B/L  GU_ swelling of penis and testicles- slightly better- is elevated on towel Musculoskeletal:        General: No swelling.  Skin:    General: Skin is warm and dry.     Comments: Suprapubic laceration with staples- but has purulent drainage on dressing and looks much better- no soupiness today Neurological:     Mental Status: He is alert.     Comments: Alert and oriented x 3. Normal insight and awareness. Intact Memory. Normal language and speech. Cranial nerve exam unremarkable.  Right MMT:  3+/5 deltoid, 3+/5 bicep, 3+/5 tricep, 3/5 wrist extension, 3- to 3/5 hand intrinsics.      4 to 4+/5 hip flexor, 4/5 knee extension, 4/5 ankle dorsiflexion, 4/5 ankle plantarflexion. Left MMT: . 4 to 4+/5  deltoid, 4 to 4+/5 bicep, 4/5 tricep, 4/5 wrist extension, 4/5 hand intrinsics.      4+/5 hip flexor, 4+/5 knee extension, 4+/5 ankle dorsiflexion, 4+/5 ankle plantarflexion. Inconsistent sensory finding except for mild loss of LT and prop along pads of feet/toes bilaterally. DTR's 1+, No resting hypertonicity.     Assessment/Plan: 1. Functional deficits which require 3+ hours per day of interdisciplinary therapy in a comprehensive inpatient rehab setting. Physiatrist is providing close team supervision and 24 hour management of active medical problems listed below. Physiatrist and rehab team continue to assess barriers to discharge/monitor patient progress toward functional and medical goals  Care Tool:  Bathing              Bathing assist Assist Level: Minimal Assistance - Patient > 75%     Upper Body Dressing/Undressing Upper body dressing        Upper body assist Assist Level: Minimal Assistance - Patient > 75%    Lower Body Dressing/Undressing Lower body dressing      What is the patient wearing?: Pants     Lower body assist Assist for lower body dressing: Minimal Assistance - Patient > 75%     Toileting Toileting    Toileting assist Assist for toileting: Minimal Assistance - Patient > 75%     Transfers Chair/bed transfer  Transfers assist     Chair/bed transfer assist level: Minimal Assistance - Patient > 75%     Locomotion Ambulation   Ambulation assist      Assist level: Minimal Assistance - Patient > 75% Assistive device: Hand held assist Max distance: 56ft   Walk 10 feet activity   Assist     Assist level: Minimal Assistance - Patient > 75% Assistive device: Hand held assist   Walk 50 feet activity   Assist Walk 50 feet with 2 turns activity did not occur: Safety/medical concerns         Walk 150 feet activity   Assist Walk 150 feet activity did not occur: Safety/medical concerns         Walk 10 feet on uneven surface   activity   Assist Walk 10 feet on uneven surfaces activity did not occur: Safety/medical concerns         Wheelchair     Assist Is the patient using a wheelchair?: Yes      Wheelchair assist level: Minimal Assistance - Patient > 75% Max wheelchair distance: 122ft    Wheelchair 50 feet with 2 turns activity    Assist        Assist Level: Minimal Assistance - Patient > 75%   Wheelchair 150 feet activity  Assist      Assist Level: Moderate Assistance - Patient 50 - 74%   Blood pressure 136/85, pulse 87, temperature 99.4 F (37.4 C), resp. rate 18, height 5\' 9"  (1.753 m), weight 111.2 kg, SpO2 (!) 88 %.  Medical Problem List and Plan: 1. 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 non-displaced anterior vertebral body fracture at C7-T1 through osteophyte. Patient may remove c-collar to shower             -ELOS/Goals: 14-20 days, supervision to min assist goals with PT and OT  Continue CIR- PT, OT -  2.  Antithrombotics: -DVT/anticoagulation:  Pharmaceutical: Heparin             -antiplatelet therapy: none 3. Pain Management: Tylenol prn  1/7- will try Tramadol 50 mg q6 hours prn for pain 4. Mood: Lcsw to evaluate and provide emotional support             -antipsychotic agents: n/a 5. Neuropsych: This patient is capable of making decisions on his own behalf. 6. Scrotal edema: Routine skin care checks  1/7- laceration of pubic area- will start Keflex 500 mg q8 hours x 7 days and monitor and elevate scrotum and penis for edema control.   1/8- Swelling a little better- con't elevation- looks much better- con't Keflex x total of 7 days  1/9: messaged OT about trying scrotal compression garment 7. 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 1/7- Bgs controlled con't regimen 9. Hypertension: continue Norvasc, HCTZ, Zestril  1/7- BP controlled- cont regimen 10: Hyperlipidemia: continue Lipitor 11: Depression: well controlled on Wellbutrin (home med continued) 12: Constipation: pt has had some flatus, no bm since admit. ?mild neurogenic component             -sorbitol tomorrow after therapies.  SSE if needed afterwards  1/7- will give sorbitol after therapy today. Has control of stool per pt.   1/8- Needs to go still, but did have decent sized BM- will let him try Miralax, but might need Sorbitol again tomorrow?  1/9: improved, continue miralax.   13: Mild hypokalemia: recheck serum potassium in AM  1/7- K+ 3.6- will recheck Monday  14. Significant ileus with 9+ cm dilation of small bowel - abdominal pain improved 1/9, d/w pt- placing an NGT for decompression of stomach/SB- on intermittent 125 mmHg suction- also changed diet to clear liquid- might need IVFs if things don't improve rapidly- Also ordered Ativan prn for NGT placement. Repeat KUB suggestive of chronic ileus. Requested 3/9 to inform his outpatient GI doc for any further recommendations.    LOS: 3 days A FACE TO FACE EVALUATION WAS PERFORMED  Taline Nass P Aalyah Mansouri 01/08/2022, 11:00 AM

## 2022-01-08 NOTE — Transfer of Care (Signed)
Immediate Anesthesia Transfer of Care Note  Patient: Arthur Farrell  Procedure(s) Performed: COLONOSCOPY WITH PROPOFOL  Patient Location: Endoscopy Unit  Anesthesia Type:MAC  Level of Consciousness: awake and alert   Airway & Oxygen Therapy: Patient Spontanous Breathing  Post-op Assessment: Report given to RN and Post -op Vital signs reviewed and stable  Post vital signs: Reviewed and stable  Last Vitals:  Vitals Value Taken Time  BP 101/69 01/08/22 1557  Temp 36.2 C 01/08/22 1553  Pulse 77 01/08/22 1557  Resp 14 01/08/22 1557  SpO2 94 % 01/08/22 1557  Vitals shown include unvalidated device data.  Last Pain:  Vitals:   01/08/22 1553  TempSrc: Temporal  PainSc: 0-No pain         Complications: No notable events documented.
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
## 2022-01-08 NOTE — Progress Notes (Signed)
Abdominal distention persisting despite NGT decompression. Received Tramadol on Saturday otherwise only taking Tylenol for pain as needed. Tells me he has not passed flatus in last 24+ hours. No significant abd pain complaints. On clear liquids.  On exam, moderately distended with tympany on percussion, no peritoneal signs. Bowel sounds consistent with gurgles and rushes. 500 cc brown colored NGT output. Normal electrolytes on Saturday.  Discussed with Dr. Carlis Abbott. Will consult GI. Spoke with  Lianne Bushy, PA-C.

## 2022-01-09 ENCOUNTER — Inpatient Hospital Stay (HOSPITAL_COMMUNITY): Payer: No Typology Code available for payment source

## 2022-01-09 ENCOUNTER — Encounter (HOSPITAL_COMMUNITY): Payer: Self-pay | Admitting: Internal Medicine

## 2022-01-09 DIAGNOSIS — E876 Hypokalemia: Secondary | ICD-10-CM

## 2022-01-09 DIAGNOSIS — K5981 Ogilvie syndrome: Secondary | ICD-10-CM

## 2022-01-09 LAB — GLUCOSE, CAPILLARY
Glucose-Capillary: 105 mg/dL — ABNORMAL HIGH (ref 70–99)
Glucose-Capillary: 110 mg/dL — ABNORMAL HIGH (ref 70–99)
Glucose-Capillary: 119 mg/dL — ABNORMAL HIGH (ref 70–99)
Glucose-Capillary: 290 mg/dL — ABNORMAL HIGH (ref 70–99)
Glucose-Capillary: 85 mg/dL (ref 70–99)

## 2022-01-09 IMAGING — CR DG ABDOMEN 2V
3 series · 3 of 3 positions shown · non-contrast
Comparison: Abdominal radiographs 1 day prior

CLINICAL DATA: Ileus

EXAM:
ABDOMEN - 2 VIEW

[abdomen erect]
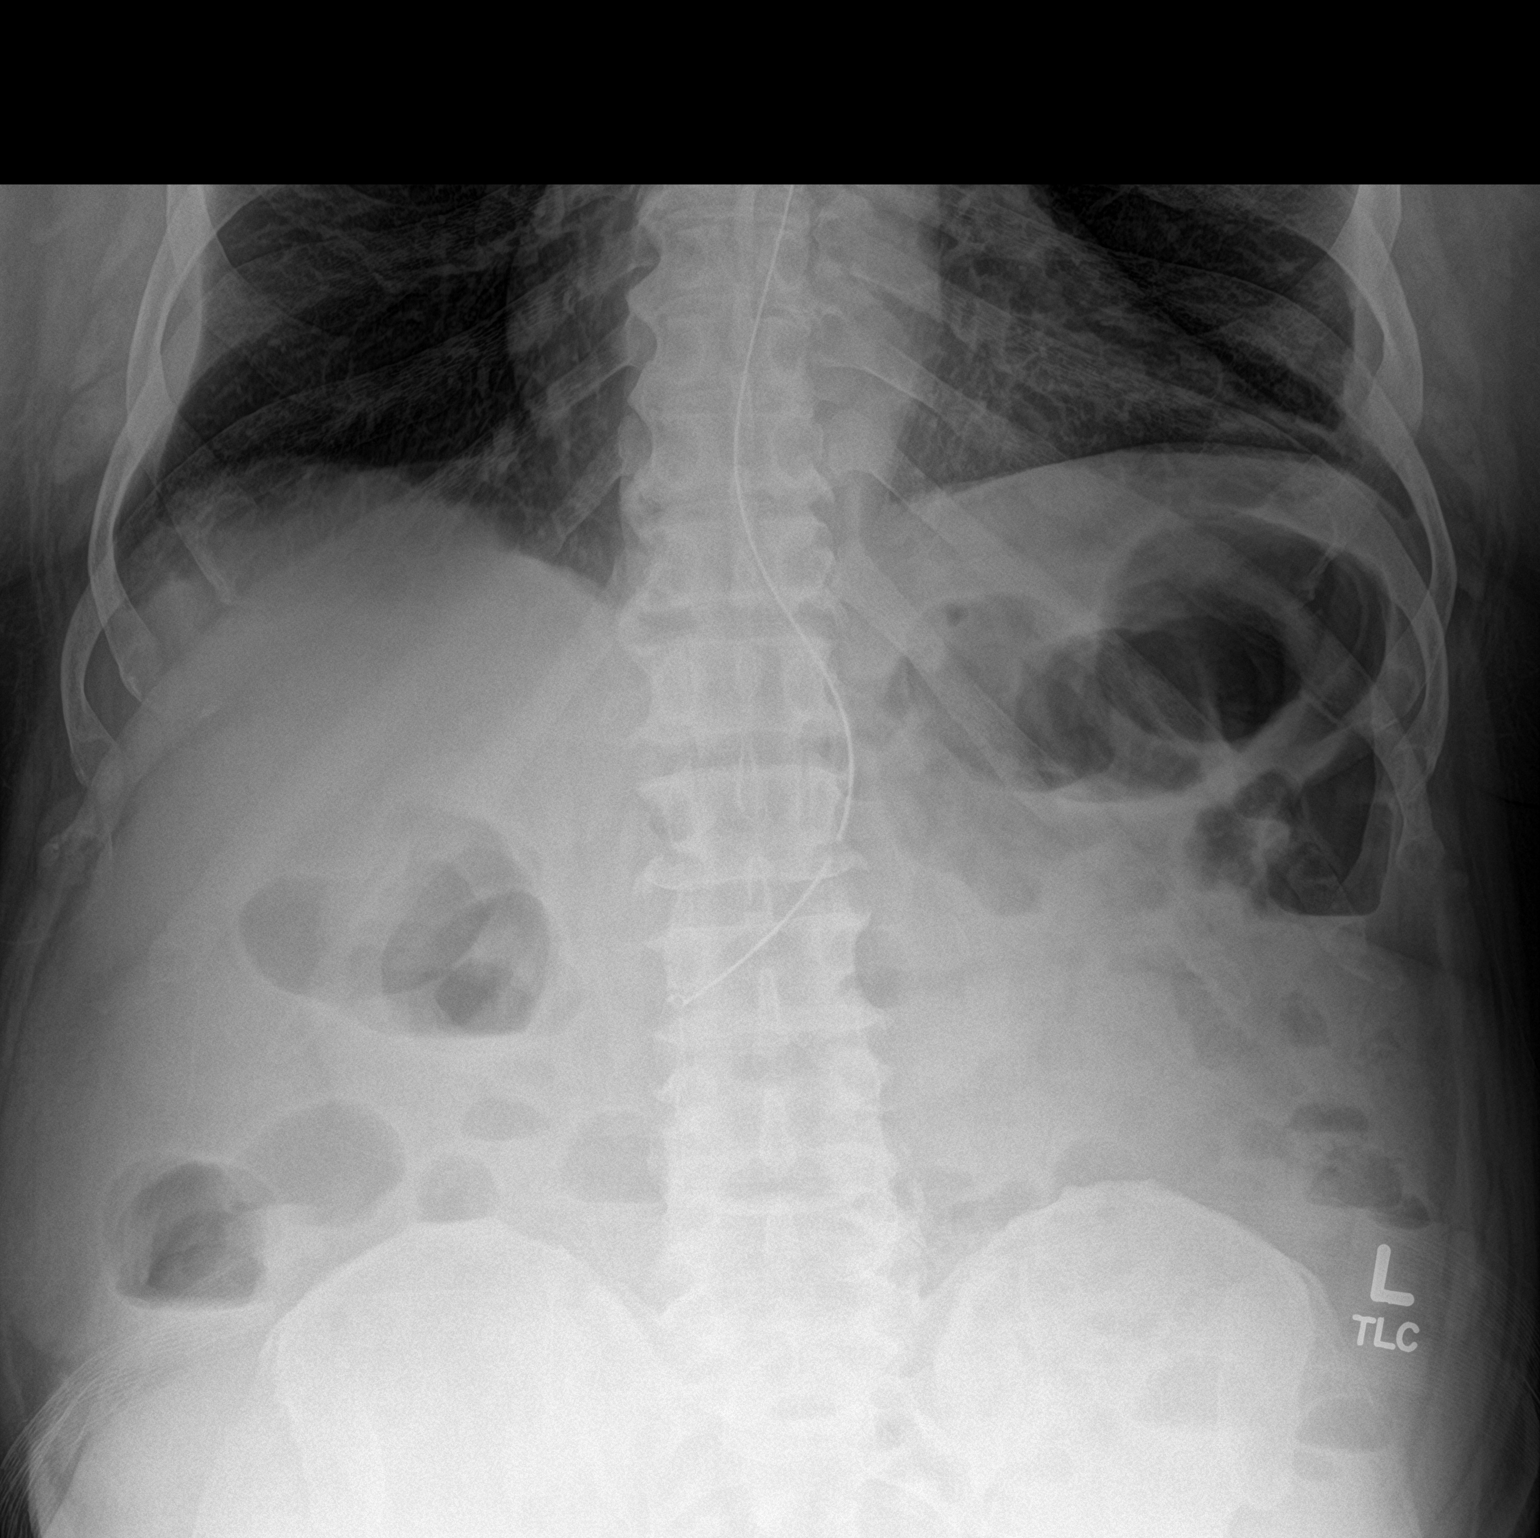

[abdomen supine (1 of 2)]
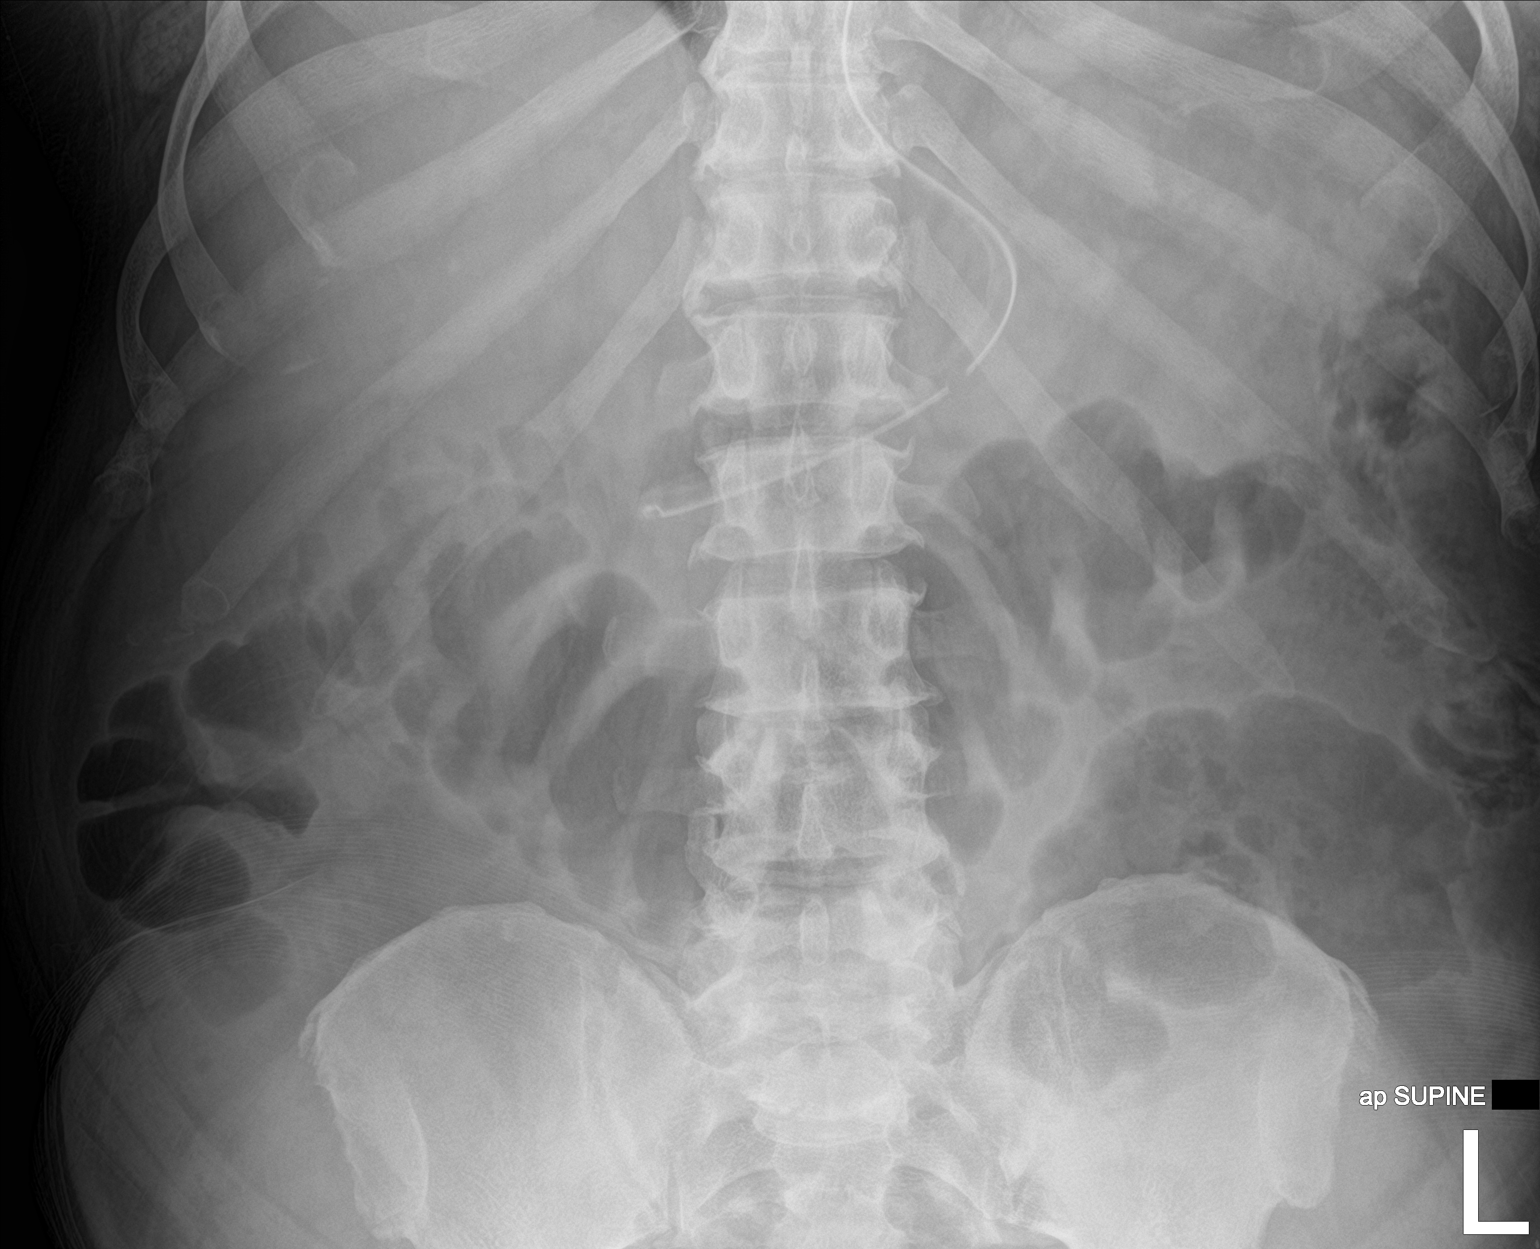

[abdomen supine (2 of 2)]
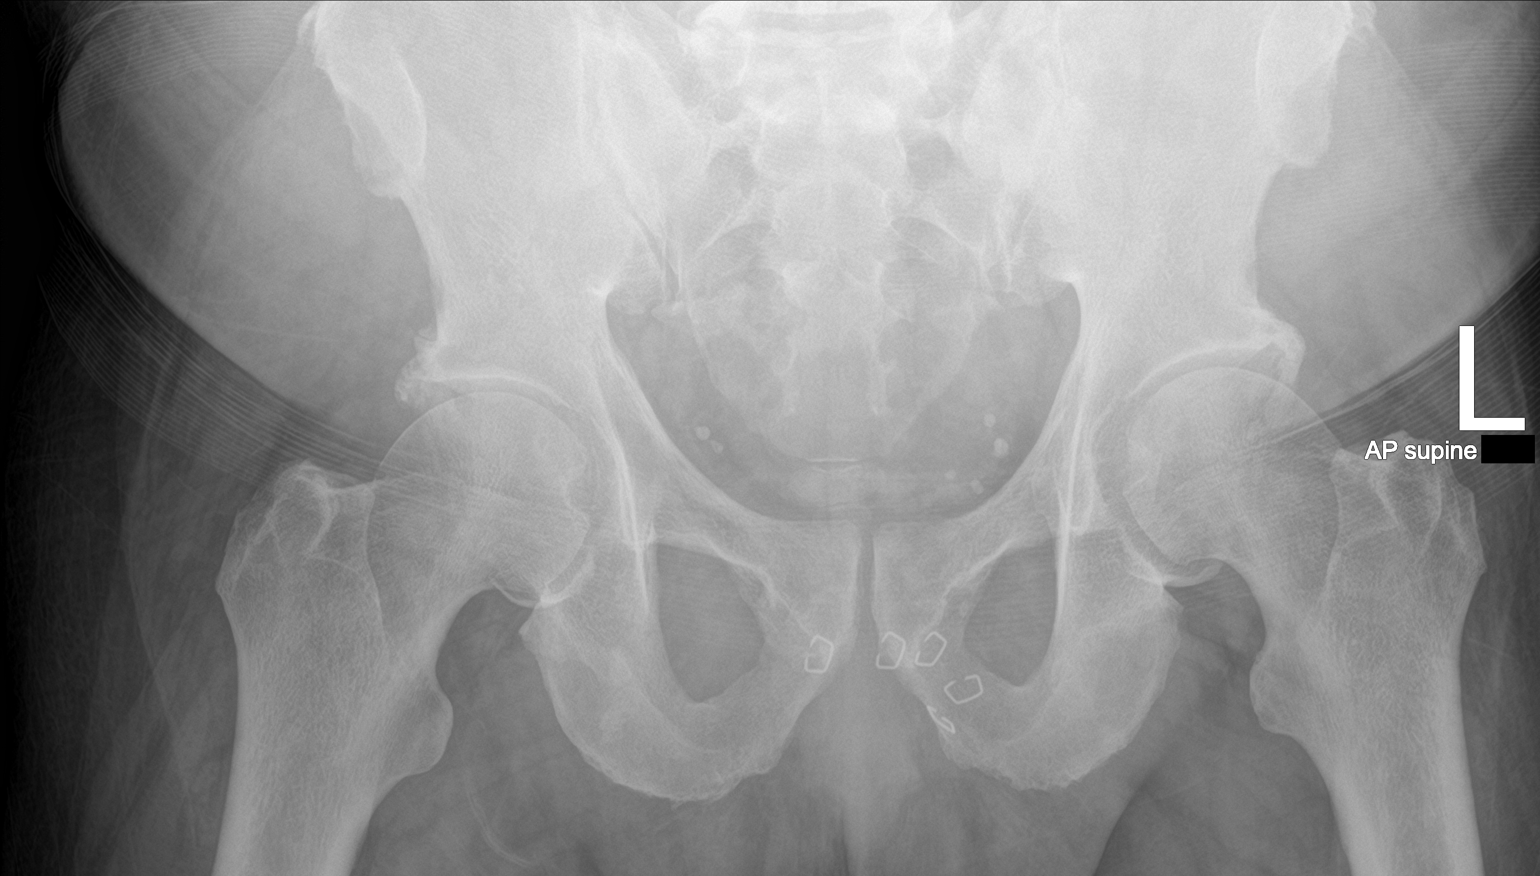

[3 of 3 positions shown; findings below may reference images not displayed]

FINDINGS: The enteric catheter tip and sidehole project over the stomach.

Gaseous distention of the large bowel is stable to slightly improved
compared to the study from 1 day prior. There is no increasing
small-bowel distention. There is no definite free intraperitoneal
air. There is no abnormal soft tissue calcification.

The bones are stable.
IMPRESSION: 1. Enteric catheter tip and sidehole project over the stomach.
2. Gaseous distention of the large bowel is stable to slightly
improved compared to the study from 1 day prior.
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
## 2022-01-09 MED ORDER — POTASSIUM CHLORIDE 10 MEQ/100ML IV SOLN
10.0000 meq | INTRAVENOUS | Status: DC
  Filled 2022-01-09 (×3): qty 100

## 2022-01-09 MED ORDER — BISACODYL 10 MG RE SUPP
10.0000 mg | Freq: Once | RECTAL | Status: AC
  Administered 2022-01-09: 10 mg via RECTAL
  Filled 2022-01-09: qty 1

## 2022-01-09 MED ORDER — IOHEXOL 9 MG/ML PO SOLN
ORAL | Status: AC
  Administered 2022-01-09: 500 mL
  Filled 2022-01-09: qty 1000

## 2022-01-09 MED ORDER — IOHEXOL 350 MG/ML SOLN
100.0000 mL | Freq: Once | INTRAVENOUS | Status: AC | PRN
  Administered 2022-01-09: 100 mL via INTRAVENOUS

## 2022-01-09 MED ORDER — POTASSIUM CHLORIDE 10 MEQ/100ML IV SOLN
10.0000 meq | INTRAVENOUS | Status: AC
  Administered 2022-01-09 (×3): 10 meq via INTRAVENOUS
  Filled 2022-01-09 (×3): qty 100

## 2022-01-09 NOTE — Progress Notes (Signed)
Pt out of unit for abd x-ray.
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
## 2022-01-09 NOTE — Progress Notes (Signed)
Occupational Therapy Session Note  Patient Details  Name: Kristoff Coonradt MRN: [id number] Date of Birth: Feb 13, 1956  Today's Date: 01/09/2022 OT Individual Time: 1130-1200 OT Individual Time Calculation (min): 30 min    Short Term Goals: Week 1:  OT Short Term Goal 1 (Week 1): STGs=LTGs due to ELOS  Skilled Therapeutic Interventions/Progress Updates:    Pt resting in bed upon arrival with nursing present. Pt with increased edema in scrotum. [place] prepared to use as sling. Pt requested to use toilet in bathroom. Bed mobility with supervision. Pt amb with RW with assistance only to manage IV pole. Pt with no results on toilet and returned to recliner. Pt remained in recliner with NT present.   Therapy Documentation Precautions:  Precautions Precautions: Fall, Cervical Required Braces or Orthoses: Cervical Brace Cervical Brace: Hard collar, At all times (except for shower) Restrictions Weight Bearing Restrictions: No   Pain: Pt c/o pain 2/2 scrotal edema; repositioned   Therapy/Group: Individual Therapy  [name] 01/09/2022, 12:07 PM

## 2022-01-09 NOTE — Progress Notes (Signed)
Physical Therapy Session Note  Patient Details  Name: Arthur Farrell MRN: [id number] Date of Birth: [birth year]  Today's Date: 01/09/2022 PT Individual Time: 1015-1055; 1300-1330 PT Individual Time Calculation (min): 40 min and 30 min PT Missed Time: 30 min Missed Time Reason: nursing care  Short Term Goals: Week 1:  PT Short Term Goal 1 (Week 1): STG=LTG due to ELOS  Skilled Therapeutic Interventions/Progress Updates:    Session 1: Pt received seated in recliner in room, agreeable to PT session. Pt reports pain in his low back/sciatic region with transfers that improves with mobility. Pt also reports increase in scrotal swelling and increase in pain in scrotum with mobility. Sit to stand with CGA to RW during session. Ambulation 3 x 150 ft with RW at [place] level during session. Ascend/descend 12 x 6" stairs with 2 handrails and CGA for balance, alternating gait pattern. Pt left seated in recliner in room with needs in reach, quick release belt and chair alarm in place in at end of session. Placed rolled up washcloth under scrotum for elevation to assist with edema management.  Session 2: Pt received seated in recliner in room, agreeable to PT session. No complaints of pain at rest. Sit to stand with CGA to RW, pt then noted to have excessive bleeding once in standing position from his groin region. Pt returned to sitting and nursing notified. Source of bleeding identified as suprapubic laceration. Pt handed off to nursing and to PA for continued care. Pt missed 30 min of scheduled therapy session for nursing care.  Therapy Documentation Precautions:  Precautions Precautions: Fall, Cervical Required Braces or Orthoses: Cervical Brace Cervical Brace: Hard collar, At all times (except for shower) Restrictions Weight Bearing Restrictions: No     Therapy/Group: Individual Therapy   Excell Seltzer, PT, DPT, CSRS  01/09/2022, 12:24 PM

## 2022-01-09 NOTE — Progress Notes (Addendum)
Patient ID: Arthur Farrell, male   DOB: [birth year], 66 y.o.   MRN: [id number]  SW met with pt in room to provide updates from team conference, and d/c date 1/16. He is aware SW will order RW and TTB. Pt remains undecided if he will d/c to his sister's home.   SW spoke with pt sister Chalmers Guest [phone]) to provide above above. She is willing to have DME shipped to her home: [address], [place], [place] [address]. She continues to report he can stay in her home as well. [facility]erred [facility] location [facility] . Sw will confirm with pt and sister d/c location and outpatient center. Fam edu on Friday (1/13) 10am-12pm with his sister Chalmers Guest and niece Charlena Cross.  SW sent outpatient orders and DME orders to [facility].  Loralee Pacas, MSW, [place] Office: [phone] Cell: [phone] Fax: [phone]

## 2022-01-09 NOTE — Patient Care Conference (Signed)
Inpatient RehabilitationTeam Conference and Plan of Care Update Date: 01/09/2022   Time: 11:01 AM    Patient Name: Arthur Farrell      Medical Record Number: [id number]  Date of Birth: 11-19-1956 Sex: Male         Room/Bed: 4M02C/4M02C-01 Payor Info: Payor: [employer] / Plan: [facility] / Product Type: *No Product type* /    Admit Date/Time:  01/05/2022  3:19 PM  Primary Diagnosis:  Central cord syndrome [facility])  Hospital Problems: Principal Problem:   Central cord syndrome [facility]) Active Problems:   Colonic pseudoobstruction   Abdominal discomfort    Expected Discharge Date: Expected Discharge Date: 01/15/22  Team Members Present: Physician leading conference: Dr. Faith Rogue Social Worker Present: Cecile Sheerer, LCSWA Nurse Present: Kennyth Arnold, RN PT Present: Peter Congo, PT OT Present: Ardis Rowan, COTA;Jennifer Katrinka Blazing, OT PPS Coordinator present : Fae Pippin, SLP     Current Status/Progress Goal Weekly Team Focus  Bowel/Bladder   cont of Bowel x Bladder. last BM-01/06/22  remain cont.  assess q shift and PRN   Swallow/Nutrition/ Hydration             ADL's   CGA/min A overall per evaluation  mod I overall  activity tolerance, balance, BADLs, safety awareness, education   Mobility   min A per notes  Supervision to mod I overall  endurance, gait, balance, safety, pain management   Communication             Safety/Cognition/ Behavioral Observations            Pain   pain 4 of 10 to neck  pain<3  assess pain q shift and PRN   Skin   incision to groin area, staples  proper incision healing  assess skin q shift and PRN     Discharge Planning:  Live alone. Can go to sister's at discharge.   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 Fall with decompression of spinal cord. Developed ileus, GI following. NG tube placed, NPO currently. Colonoscopy 01/08/22. Reports 6/10 pain to hip and neck and won't take pain  medication. Laceration to groin with 3-4 staples, draining at night. Patient reports swelling worse. Min assist overall, contact guard today. Making good progress. Will need RW and tub transfer bench. Recommending outpatient therapy. Patient on target to meet rehab goals: yes, mod I goals.  *See Care Plan and progress notes for long and short-term goals.   Revisions to Treatment Plan:  Adjusting medications, GI following  Teaching Needs: Family education, medication/pain management, skin/wound care, safety awareness, transfer/gait training, etc.  Current Barriers to Discharge: Decreased caregiver support, Wound care, Lack of/limited family support, Weight, Medication compliance, and pain.  Possible Resolutions to Barriers: Family education Order recommended DME Follow-up outpatient therapy Follow-up GI     Medical Summary Current Status: c3-c5 central cord. developed ileus--NGT, GI is following. groin wound with drainage--keflex  Barriers to Discharge: Medical stability   Possible Resolutions to [employer] Focus: rx ileus, treat wound/infection. daily assessment of labs and patient data   Continued Need for Acute Rehabilitation Level of Care: The patient requires daily medical management by a physician with specialized training in physical medicine and rehabilitation for the following reasons: Direction of a multidisciplinary physical rehabilitation program to maximize functional independence : Yes Medical management of patient stability for increased activity during participation in an intensive rehabilitation regime.: Yes Analysis of laboratory values and/or radiology reports with any subsequent need for medication adjustment and/or medical intervention. :  Yes   I attest that I was present, lead the team conference, and concur with the assessment and plan of the team.   Tennis Must 01/09/2022, 3:52 PM

## 2022-01-09 NOTE — Progress Notes (Signed)
Occupational Therapy Session Note  Patient Details  Name: Jordin Vicencio MRN: [id number] Date of Birth: 01-Oct-1956  Today's Date: 01/09/2022 OT Individual Time: 2409-7353 OT Individual Time Calculation (min): 70 min    Short Term Goals: Week 1:  OT Short Term Goal 1 (Week 1): STGs=LTGs due to ELOS  Skilled Therapeutic Interventions/Progress Updates:    OT intervention with focus on bed mobility, functional amb with RW, bathing at shower level, and dressing with sit<>stand from EOB. Amb with RW in room and to bathroom with CGA. Bathing with CGA when standing. CGA for standing balance when pulling pants over hips. Pt required assistance donning socks/shoes without AE. Pt amb with RW to recliner and remained in recliner with all needs wihtin reach.   Therapy Documentation Precautions:  Precautions Precautions: Fall, Cervical Required Braces or Orthoses: Cervical Brace Cervical Brace: Hard collar, At all times (except for shower) Restrictions Weight Bearing Restrictions: No   Pain: Pt c/o pain 2/2 edema in scrotum; repositioned   Therapy/Group: Individual Therapy  [name] 01/09/2022, 9:31 AM

## 2022-01-09 NOTE — Progress Notes (Signed)
Pt has a NG and unable to wear CPAP

## 2022-01-09 NOTE — Progress Notes (Signed)
PROGRESS NOTE   Subjective/Complaints: Belly feeling a little better. Had some oozing from suprapubic lac.   ROS: Patient denies fever, rash, sore throat, blurred vision,   diarrhea, cough, shortness of breath or chest pain, joint or back pain, headache, or mood change.     Objective:   DG Abd 1 View  Result Date: 01/07/2022 CLINICAL DATA:  NG tube placement EXAM: ABDOMEN - 1 VIEW COMPARISON:  01/07/2022 FINDINGS: NG tube is in the stomach. Dilated bowel loops again noted, similar prior study. No organomegaly or free air. IMPRESSION: NG tube in the stomach. No change in the diffuse bowel distention Electronically Signed   By: Charlett Nose M.D.   On: 01/07/2022 22:32   DG Abd 1 View  Result Date: 01/07/2022 CLINICAL DATA:  Abdominal discomfort and swelling EXAM: ABDOMEN - 1 VIEW COMPARISON:  None. FINDINGS: Diffusely gas-filled, somewhat distended small bowel and colon, largest loops of colon measuring up to 9.6 cm. Gas is present to the rectum. No free air in the abdomen on supine radiographs. Surgical staples project over the left inferior pubic ramus. IMPRESSION: Diffusely gas-filled, somewhat distended small bowel and colon, largest loops of colon measuring up to 9.6 cm. Gas is present to the rectum. Findings suggest ileus. No obvious free air in the abdomen on supine radiographs. Electronically Signed   By: Jearld Lesch M.D.   On: 01/07/2022 16:21   DG Abd 2 Views  Result Date: 01/09/2022 CLINICAL DATA:  Ileus EXAM: ABDOMEN - 2 VIEW COMPARISON:  Abdominal radiographs 1 day prior FINDINGS: The enteric catheter tip and sidehole project over the stomach. Gaseous distention of the large bowel is stable to slightly improved compared to the study from 1 day prior. There is no increasing small-bowel distention. There is no definite free intraperitoneal air. There is no abnormal soft tissue calcification. The bones are stable. IMPRESSION: 1.  Enteric catheter tip and sidehole project over the stomach. 2. Gaseous distention of the large bowel is stable to slightly improved compared to the study from 1 day prior. Electronically Signed   By: Lesia Hausen M.D.   On: 01/09/2022 08:43   DG Abd Portable 1V  Result Date: 01/08/2022 CLINICAL DATA:  66 year old male with abdominal pain. EXAM: PORTABLE ABDOMEN - 1 VIEW COMPARISON:  01/07/2022 and earlier, including CT Abdomen and Pelvis 01/02/2022. FINDINGS: Portable AP supine view at 0502 hours. Enteric tube remains in place with side hole at the level of the gastric body. Diffuse gaseous distension of the colon on going. But no dilated small bowel loops. Paucity of distal colonic gas, none in the pelvis now. No pneumoperitoneum evident on this supine view. Pelvic phleboliths. No acute osseous abnormality identified. IMPRESSION: 1. Stable enteric tube. 2. Gas distended large bowel to the distal descending or sigmoid. Decreased small bowel gas since 01/07/2022. Given absence of an obstructing distal large bowel lesion on the recent CT favor this is colonic ileus. Electronically Signed   By: Odessa Fleming M.D.   On: 01/08/2022 07:00   No results for input(s): WBC, HGB, HCT, PLT in the last 72 hours.  Recent Labs    01/08/22 1353  NA 134*  K 3.4*  CL 95*  CO2 29  GLUCOSE 112*  BUN 20  CREATININE 1.05  CALCIUM 9.2    Intake/Output Summary (Last 24 hours) at 01/09/2022 1353 Last data filed at 01/09/2022 0523 Gross per 24 hour  Intake 1286.23 ml  Output 775 ml  Net 511.23 ml        Physical Exam: Vital Signs Blood pressure 110/72, pulse 70, temperature 99 F (37.2 C), temperature source Oral, resp. rate 20, height 5\' 9"  (1.753 m), weight 111.2 kg, SpO2 94 %. Constitutional: No distress . Vital signs reviewed. obese HEENT: NCAT, EOMI, oral membranes moist Neck: supple Cardiovascular: RRR without murmur. No JVD    Respiratory/Chest: CTA Bilaterally without wheezes or rales. Normal effort     GI/Abdomen: BS scarce and high pitched, distended Ext: no clubbing, cyanosis  Psych: pleasant and cooperative  GU_ ongoing swelling of penis and testicles- slightly better- is elevated on towel Musculoskeletal:        General: LE edema  Skin:    General: Skin is warm and dry.     Comments: Suprapubic laceration with staples-bloody drainage, dressed. Neurological:     Mental Status: He is alert.     Comments: Alert and oriented x 3. Normal insight and awareness. Intact Memory. Normal language and speech. Cranial nerve exam unremarkable.  Right MMT:  3+/5 deltoid, 3+/5 bicep, 3+/5 tricep, 3/5 wrist extension, 3- to 3/5 hand intrinsics.      4 to 4+/5 hip flexor, 4/5 knee extension, 4/5 ankle dorsiflexion, 4/5 ankle plantarflexion. Left MMT: . 4 to 4+/5 deltoid, 4 to 4+/5 bicep, 4/5 tricep, 4/5 wrist extension, 4/5 hand intrinsics.      4+/5 hip flexor, 4+/5 knee extension, 4+/5 ankle dorsiflexion, 4+/5 ankle plantarflexion. Motor exam stable  Assessment/Plan: 1. Functional deficits which require 3+ hours per day of interdisciplinary therapy in a comprehensive inpatient rehab setting. Physiatrist is providing close team supervision and 24 hour management of active medical problems listed below. Physiatrist and rehab team continue to assess barriers to discharge/monitor patient progress toward functional and medical goals  Care Tool:  Bathing    Body parts bathed by patient: Right arm, Left arm, Chest, Abdomen, Front perineal area, Buttocks, Right upper leg, Left upper leg, Right lower leg, Left lower leg, Face         Bathing assist Assist Level: Contact Guard/Touching assist     Upper Body Dressing/Undressing Upper body dressing   What is the patient wearing?: Pull over shirt    Upper body assist Assist Level: Supervision/Verbal cueing    Lower Body Dressing/Undressing Lower body dressing      What is the patient wearing?: Pants     Lower body assist Assist for lower body  dressing: Contact Guard/Touching assist     Toileting Toileting    Toileting assist Assist for toileting: Supervision/Verbal cueing     Transfers Chair/bed transfer  Transfers assist     Chair/bed transfer assist level: Contact Guard/Touching assist     Locomotion Ambulation   Ambulation assist      Assist level: Contact Guard/Touching assist Assistive device: Walker-rolling Max distance: 150'   Walk 10 feet activity   Assist     Assist level: Contact Guard/Touching assist Assistive device: Walker-rolling   Walk 50 feet activity   Assist Walk 50 feet with 2 turns activity did not occur: Safety/medical concerns  Assist level: Contact Guard/Touching assist Assistive device: Walker-rolling    Walk 150 feet activity   Assist Walk 150 feet activity did not occur:  Safety/medical concerns  Assist level: Contact Guard/Touching assist Assistive device: Walker-rolling    Walk 10 feet on uneven surface  activity   Assist Walk 10 feet on uneven surfaces activity did not occur: Safety/medical concerns         Wheelchair     Assist Is the patient using a wheelchair?: Yes      Wheelchair assist level: Minimal Assistance - Patient > 75% Max wheelchair distance: 17200ft    Wheelchair 50 feet with 2 turns activity    Assist        Assist Level: Minimal Assistance - Patient > 75%   Wheelchair 150 feet activity     Assist      Assist Level: Moderate Assistance - Patient 50 - 74%   Blood pressure 110/72, pulse 70, temperature 99 F (37.2 C), temperature source Oral, resp. rate 20, height 5\' 9"  (1.753 m), weight 111.2 kg, SpO2 94 %.  Medical Problem List and Plan: 1. 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 non-displaced anterior vertebral  body fracture at C7-T1 through osteophyte. Patient may remove c-collar to shower             -ELOS/Goals: 14-20 days, supervision to min assist goals with PT and OT  -Continue CIR therapies including PT and OT. Interdisciplinary team conference today to discuss goals, barriers to discharge, and dc planning.  2.  Antithrombotics: -DVT/anticoagulation:  Pharmaceutical: Heparin             -antiplatelet therapy: none 3. Pain Management: Tylenol prn  1/7- will try Tramadol 50 mg q6 hours prn for pain 4. Mood: Lcsw to evaluate and provide emotional support             -antipsychotic agents: n/a 5. Neuropsych: This patient is capable of making decisions on his own behalf. 6. Scrotal edema: Routine skin care checks  1/10 scrotal wound re-dressed. Continue keflex 7. Fluids/Electrolytes/Nutrition: routine Is and Os and follow-up chemistries 8: DM-insulin requiring: Hgb A1c = 6.1 --continue SSI>>Novolog 0-15u --continue Metformin -CBG (last 3)  Recent Labs    01/09/22 0623 01/09/22 1202 01/09/22 1250  GLUCAP 119* 290* 105*  1/10 -continue with SSI as we address ileus 9. Hypertension: continue Norvasc, HCTZ, Zestril  1/7- BP controlled- cont regimen 10: Hyperlipidemia: continue Lipitor 11: Depression: well controlled on Wellbutrin (home med continued)   13: Mild hypokalemia: continue K+ runs and with IVF  14. Colonic ileus:   -GI following, s/p endoscopic decompression -continue NGT, allow sips? -CT abdomen pelvis pending -today's KUB sl improved    LOS: 4 days A FACE TO FACE EVALUATION WAS PERFORMED  Ranelle OysterZachary T Garvin Ellena 01/09/2022, 1:53 PM
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
## 2022-01-10 DIAGNOSIS — K651 Peritoneal abscess: Secondary | ICD-10-CM

## 2022-01-10 LAB — CBC WITH DIFFERENTIAL/PLATELET
Abs Immature Granulocytes: 0.03 10*3/uL (ref 0.00–0.07)
Basophils Absolute: 0 10*3/uL (ref 0.0–0.1)
Basophils Relative: 0 %
Eosinophils Absolute: 0.3 10*3/uL (ref 0.0–0.5)
Eosinophils Relative: 3 %
HCT: 31.3 % — ABNORMAL LOW (ref 39.0–52.0)
Hemoglobin: 9.8 g/dL — ABNORMAL LOW (ref 13.0–17.0)
Immature Granulocytes: 0 %
Lymphocytes Relative: 14 %
Lymphs Abs: 1.2 10*3/uL (ref 0.7–4.0)
MCH: 19.4 pg — ABNORMAL LOW (ref 26.0–34.0)
MCHC: 31.3 g/dL (ref 30.0–36.0)
MCV: 62 fL — ABNORMAL LOW (ref 80.0–100.0)
Monocytes Absolute: 1.1 10*3/uL — ABNORMAL HIGH (ref 0.1–1.0)
Monocytes Relative: 12 %
Neutro Abs: 6.4 10*3/uL (ref 1.7–7.7)
Neutrophils Relative %: 71 %
Platelets: 324 10*3/uL (ref 150–400)
RBC: 5.05 MIL/uL (ref 4.22–5.81)
RDW: 16.3 % — ABNORMAL HIGH (ref 11.5–15.5)
WBC: 9.1 10*3/uL (ref 4.0–10.5)
nRBC: 0 % (ref 0.0–0.2)

## 2022-01-10 LAB — GLUCOSE, CAPILLARY
Glucose-Capillary: 100 mg/dL — ABNORMAL HIGH (ref 70–99)
Glucose-Capillary: 109 mg/dL — ABNORMAL HIGH (ref 70–99)
Glucose-Capillary: 114 mg/dL — ABNORMAL HIGH (ref 70–99)
Glucose-Capillary: 123 mg/dL — ABNORMAL HIGH (ref 70–99)
Glucose-Capillary: 91 mg/dL (ref 70–99)

## 2022-01-10 MED ORDER — ATORVASTATIN CALCIUM 40 MG PO TABS
40.0000 mg | ORAL_TABLET | Freq: Every day | ORAL | Status: DC
  Administered 2022-01-11 – 2022-01-15 (×5): 40 mg via ORAL
  Filled 2022-01-10 (×5): qty 1

## 2022-01-10 MED ORDER — BUPROPION HCL 75 MG PO TABS
75.0000 mg | ORAL_TABLET | Freq: Two times a day (BID) | ORAL | Status: DC
  Administered 2022-01-10 – 2022-01-15 (×10): 75 mg via ORAL
  Filled 2022-01-10 (×10): qty 1

## 2022-01-10 MED ORDER — HYDROCHLOROTHIAZIDE 25 MG PO TABS
25.0000 mg | ORAL_TABLET | Freq: Every day | ORAL | Status: DC
  Administered 2022-01-11 – 2022-01-15 (×5): 25 mg via ORAL
  Filled 2022-01-10 (×5): qty 1

## 2022-01-10 MED ORDER — LISINOPRIL 20 MG PO TABS
40.0000 mg | ORAL_TABLET | Freq: Every day | ORAL | Status: DC
  Administered 2022-01-11 – 2022-01-15 (×5): 40 mg via ORAL
  Filled 2022-01-10 (×5): qty 2

## 2022-01-10 MED ORDER — FEXOFENADINE HCL 60 MG PO TABS
60.0000 mg | ORAL_TABLET | Freq: Two times a day (BID) | ORAL | Status: DC
  Administered 2022-01-10 – 2022-01-15 (×10): 60 mg via ORAL
  Filled 2022-01-10 (×10): qty 1

## 2022-01-10 MED ORDER — VITAMIN D 25 MCG (1000 UNIT) PO TABS
2000.0000 [IU] | ORAL_TABLET | Freq: Every day | ORAL | Status: DC
  Administered 2022-01-11 – 2022-01-15 (×5): 2000 [IU] via ORAL
  Filled 2022-01-10 (×5): qty 2

## 2022-01-10 MED ORDER — CEPHALEXIN 250 MG PO CAPS
500.0000 mg | ORAL_CAPSULE | Freq: Three times a day (TID) | ORAL | Status: DC
  Administered 2022-01-10: 500 mg via ORAL
  Filled 2022-01-10: qty 2

## 2022-01-10 NOTE — Progress Notes (Signed)
Occupational Therapy Session Note  Patient Details  Name: Arthur Farrell MRN: [id number] Date of Birth: March 29, 1956  Today's Date: 01/10/2022 OT Individual Time: 1300-1339 OT Individual Time Calculation (min): 39 min    Short Term Goals: Week 1:  OT Short Term Goal 1 (Week 1): STGs=LTGs due to ELOS  Skilled Therapeutic Interventions/Progress Updates:    Pt resting in bed upon arrival. Initial OT intervention with focus on fabricating and donning sling for scrotum using stockinet material. After placement, sling "straps placed under Bil axilla and over shoulders, avoiding pressure on cervial collar or cervical vertebrae. Bed mobility with supervision and amb in room with supervision. Pt also donned mesh underwear for additional support. Pt amb with RW to recliner. Pt remained in recliner with all needs within reach.  Therapy Documentation Precautions:  Precautions Precautions: Fall, Cervical Required Braces or Orthoses: Cervical Brace Cervical Brace: Hard collar, At all times (except for shower) Restrictions Weight Bearing Restrictions: No General: General PT Missed Treatment Reason: Other (Comment) Vital Signs: Therapy Vitals Temp: 97.8 F (36.6 C) Temp Source: Oral Pulse Rate: 74 Resp: 16 BP: 131/83 Patient Position (if appropriate): Sitting Oxygen Therapy SpO2: 100 % O2 Device: Room Air Pain: Pain Assessment Pain Scale: 0-10 Pain Score: 0-No pain Pain Type: Acute pain Pain Location: Scrotum Pain Descriptors / Indicators: Discomfort Pain Frequency: Constant Pain Onset: Other (Comment) (GI dr in room removing staple) Pain Intervention(s): Medication (See eMAR) ADL: ADL Eating: Set up Where Assessed-Eating: Chair Grooming: Supervision/safety Where Assessed-Grooming: Standing at sink Upper Body Bathing: Supervision/safety (using long handled sponge) Where Assessed-Upper Body Bathing: Shower Lower Body Bathing: Minimal assistance (using long handled sponge after  training) Where Assessed-Lower Body Bathing: Shower Upper Body Dressing: Minimal assistance (to donn shirt over collar and to donn/doff C-collar) Where Assessed-Upper Body Dressing: Edge of bed Lower Body Dressing: Minimal assistance (using reacher and sock aide after training provided.) Where Assessed-Lower Body Dressing: Edge of bed Toileting: Contact guard Where Assessed-Toileting: Teacher, adult education: Furniture conservator/restorer Method: Event organiser: Administrator, arts Method: Designer, industrial/product: Scientist, research (physical sciences)   Exercises:   Other Treatments:     Therapy/Group: Individual Therapy  [name] 01/10/2022, 1:47 PM
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
## 2022-01-10 NOTE — Progress Notes (Signed)
Physical Therapy Session Note  Patient Details  Name: Arthur Farrell MRN: [id number] Date of Birth: [birth year]  Today's Date: 01/10/2022 PT Individual Time: 1020-1110 PT Individual Time Calculation (min): 50 min   Short Term Goals: Week 1:  PT Short Term Goal 1 (Week 1): STG=LTG due to ELOS  Skilled Therapeutic Interventions/Progress Updates: Pt presented in bed agreeable to therapy. Pt states just received pain meds due to recently changed wound. Pt requesting some time to allow meds to "kick in". PTA returned 20 min later with pt agreeable for mobility. PTA assisted pt with donning pants due to some hesitation of splitting incision again. Once pants on legs performed supine to sit with supervision and performed Sit to stand to pull pants over hips. Pt then ambulated to rehab gym with CGA fading to close supervision with pt demonstrating fair posture and good placement of RW. After brief seated rest, pt indicated increased pain in R upper/middle trap area, pt indicated had used Theracane in past with PTA providing Theracane and pt able to use with relief. Pt then participated in ascending/descending x 16 steps with B rails with supervision and step through pattern. Pt then ambulated to high/low mat and participated in re-bounder for dynamic standing balance 2 x 20. After second bout GI MD arrived to speak with pt and advise able to remove NG tube. Pt ambulated back to room with supervision and MD removed tube while EOB. Pt then requesting to use bathroom. Performed ambulatory transfer to bathroom and performed toilet transfers with supervision (+ urinary void). Pt then returned to bed and left seated EOB with bed alarm on, call bell within reach and needs met.      Therapy Documentation Precautions:  Precautions Precautions: Fall, Cervical Required Braces or Orthoses: Cervical Brace Cervical Brace: Hard collar, At all times (except for shower) Restrictions Weight Bearing Restrictions:  No General: PT Amount of Missed Time (min): 20 Minutes PT Missed Treatment Reason: Other (Comment) Vital Signs:   Pain: Pain Assessment Pain Scale: 0-10 Pain Score: 0-No pain Pain Type: Acute pain Pain Location: Scrotum Pain Descriptors / Indicators: Discomfort Pain Frequency: Constant Pain Onset: Other (Comment) (GI dr in room removing staple) Pain Intervention(s): Medication (See eMAR) Mobility:   Locomotion :    Trunk/Postural Assessment :    Balance:   Exercises:   Other Treatments:      Therapy/Group: Individual Therapy  Domnic Vantol 01/10/2022, 12:55 PM

## 2022-01-10 NOTE — Progress Notes (Signed)
Patient ID: Arthur Farrell, male   DOB: 10/23/1956, 66 y.o.   MRN: [id number]  SW sent 3in1 BSC order to [facility] SW [facility].   SW faxed outpatient PT/OT referral to [facility] (p:[phone]/f:[phone]).  Cecile Sheerer, MSW, LCSWA Office: [phone] Cell: [phone] Fax: [phone]

## 2022-01-10 NOTE — Progress Notes (Signed)
Call to Dr. Rica Records regarding c-collar. Ok to discontinue hard collar and go to soft collar.

## 2022-01-10 NOTE — Progress Notes (Signed)
Occupational Therapy Session Note  Patient Details  Name: Hartford Maulden MRN: [id number] Date of Birth: 06/17/1956  Today's Date: 01/10/2022 OT Individual Time: 0174-9449 OT Individual Time Calculation (min): 60 min  and Today's Date: 01/10/2022 OT Missed Time: 15 Minutes Missed Time Reason: Nursing care   Short Term Goals: Week 1:  OT Short Term Goal 1 (Week 1): STGs=LTGs due to ELOS  Skilled Therapeutic Interventions/Progress Updates:    Pt sitting in recliner upon arrival. Pt initially wanted to take a shower. Pt with suprapubic wound preventing shower and pt understanding. Pt frustrated because he still has NG tube and GI PA informed him earlier that he may need to be NPO again. Therapeutic listening and emotional support provided. Pt requested to use toilet. Upon standing, pt's wound started draining again. RN notified. Sit<>stand and and amb with RW at supervision level. Sit>supine in bed with supervision. Pt missed 15 mins skilled OT services 2/2 nursing care for suprabupic wound. Pt remained in bed with all needs within reach.   Therapy Documentation Precautions:  Precautions Precautions: Fall, Cervical Required Braces or Orthoses: Cervical Brace Cervical Brace: Hard collar, At all times (except for shower) Restrictions Weight Bearing Restrictions: No General: General OT Amount of Missed Time: 15 Minutes   Pain: Pt c/o "neck" pain from collar and Rt lower back pain (QL); repositioned  Therapy/Group: Individual Therapy  [name] 01/10/2022, 9:25 AM
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
## 2022-01-10 NOTE — Progress Notes (Signed)
PROGRESS NOTE   Subjective/Complaints: Appreciate surgery evaluating suprapubic wound given drainage- likely hematoma Scrotal sling received but yet to be administered due to patient leaving for CT  ROS: Patient denies fever, rash, sore throat, blurred vision,   diarrhea, cough, shortness of breath or chest pain, joint or back pain, headache, or mood change.     Objective:   CT ABDOMEN PELVIS W CONTRAST  Result Date: 01/09/2022 CLINICAL DATA:  Trauma, abdominal pain EXAM: CT ABDOMEN AND PELVIS WITH CONTRAST TECHNIQUE: Multidetector CT imaging of the abdomen and pelvis was performed using the standard protocol following bolus administration of intravenous contrast. CONTRAST:  173mL OMNIPAQUE IOHEXOL 350 MG/ML SOLN COMPARISON:  01/02/2022 FINDINGS: Lower chest: Subtle increased markings are seen in the left lower lung fields suggesting subsegmental atelectasis or scarring. Hepatobiliary: No focal abnormality is seen. Pancreas: Unremarkable. Spleen: Unremarkable. Adrenals/Urinary Tract: Adrenals are not enlarged. There is no hydronephrosis. There is no perinephric fluid collection. There are no renal or ureteral stones. Urinary bladder is unremarkable. There is possible parapelvic cyst in the midportion of left kidney. Stomach/Bowel: Tip of enteric tube is seen in the antrum of the stomach. Small bowel loops are not dilated. Appendix is not dilated. There is no significant wall thickening in colon. There is incomplete distention of descending and sigmoid colon. There is no pericolic stranding. Vascular/Lymphatic: Unremarkable. Reproductive: Prostate is enlarged. There are patchy high attenuation foci in the posterolateral aspects of prostate which was not evident in the previous study. Possibility bleeding within the prostate is not excluded. Other: There is a new large fluid collection with inhomogeneous attenuation measuring approximately 10.1 x  5.4 cm anterior to the pubic symphysis extending to the right. This complex fluid collection is causing extrinsic pressure over the right lateral margin of the penis. Right pectineus muscle has enlarged in size with foci of low-density, possibly hematoma. Skin staples are seen in the suprapubic region. There are pockets of air in the subcutaneous plane, possibly due to open wound in the skin. Musculoskeletal: Degenerative changes are noted in the lumbar spine with spinal stenosis and encroachment of neural foramina at multiple levels. IMPRESSION: There is interval appearance of large complex partly cystic mass in the subcutaneous plane in the suprapubic region extending more to the right measuring approximately 10.1 cm in maximum diameter. Possibility of hematoma or infectious process should be considered. This complex fluid collection is causing extrinsic pressure over the right lateral margin of base of the penis. There is enlargement of right pectineus muscle containing areas of low attenuation possibly suggesting intramuscular hematoma. There is no demonstrable laceration in the solid organs. There is no ascites or pneumoperitoneum in the abdomen and pelvis. There are new patchy foci of high attenuation in the enlarged prostate. Possibility of bleeding within prostate is not excluded. Other findings as described in the body of the report. Electronically Signed   By: Elmer Picker M.D.   On: 01/09/2022 16:48   DG Abd 2 Views  Result Date: 01/09/2022 CLINICAL DATA:  Ileus EXAM: ABDOMEN - 2 VIEW COMPARISON:  Abdominal radiographs 1 day prior FINDINGS: The enteric catheter tip and sidehole project over the stomach. Gaseous distention of the large bowel  is stable to slightly improved compared to the study from 1 day prior. There is no increasing small-bowel distention. There is no definite free intraperitoneal air. There is no abnormal soft tissue calcification. The bones are stable. IMPRESSION: 1. Enteric  catheter tip and sidehole project over the stomach. 2. Gaseous distention of the large bowel is stable to slightly improved compared to the study from 1 day prior. Electronically Signed   By: Valetta Mole M.D.   On: 01/09/2022 08:43   Recent Labs    01/10/22 0603  WBC 9.1  HGB 9.8*  HCT 31.3*  PLT 324    Recent Labs    01/08/22 1353  NA 134*  K 3.4*  CL 95*  CO2 29  GLUCOSE 112*  BUN 20  CREATININE 1.05  CALCIUM 9.2    Intake/Output Summary (Last 24 hours) at 01/10/2022 1245 Last data filed at 01/10/2022 0936 Gross per 24 hour  Intake 1786.93 ml  Output 1975 ml  Net -188.07 ml        Physical Exam: Vital Signs Blood pressure 123/75, pulse 73, temperature 99.8 F (37.7 C), temperature source Oral, resp. rate 16, height 5\' 9"  (1.753 m), weight 111.2 kg, SpO2 97 %. Gen: no distress, normal appearing HEENT: oral mucosa pink and moist, NCAT Cardio: Reg rate Chest: normal effort, normal rate of breathing Abd: soft, non-distended Ext: no edema Psych: pleasant, normal affect GU_ ongoing swelling of penis and testicles- slightly better- is elevated on towel Musculoskeletal:        General: LE edema  Skin:    General: Skin is warm and dry.     Comments: Suprapubic laceration with staples-bloody drainage, dressed. Neurological:     Mental Status: He is alert.     Comments: Alert and oriented x 3. Normal insight and awareness. Intact Memory. Normal language and speech. Cranial nerve exam unremarkable.  Right MMT:  3+/5 deltoid, 3+/5 bicep, 3+/5 tricep, 3/5 wrist extension, 3- to 3/5 hand intrinsics.      4 to 4+/5 hip flexor, 4/5 knee extension, 4/5 ankle dorsiflexion, 4/5 ankle plantarflexion. Left MMT: . 4 to 4+/5 deltoid, 4 to 4+/5 bicep, 4/5 tricep, 4/5 wrist extension, 4/5 hand intrinsics.      4+/5 hip flexor, 4+/5 knee extension, 4+/5 ankle dorsiflexion, 4+/5 ankle plantarflexion. Motor exam stable  Assessment/Plan: 1. Functional deficits which require 3+ hours per day  of interdisciplinary therapy in a comprehensive inpatient rehab setting. Physiatrist is providing close team supervision and 24 hour management of active medical problems listed below. Physiatrist and rehab team continue to assess barriers to discharge/monitor patient progress toward functional and medical goals  Care Tool:  Bathing    Body parts bathed by patient: Right arm, Left arm, Chest, Abdomen, Front perineal area, Buttocks, Right upper leg, Left upper leg, Right lower leg, Left lower leg, Face         Bathing assist Assist Level: Contact Guard/Touching assist     Upper Body Dressing/Undressing Upper body dressing   What is the patient wearing?: Pull over shirt    Upper body assist Assist Level: Supervision/Verbal cueing    Lower Body Dressing/Undressing Lower body dressing      What is the patient wearing?: Pants     Lower body assist Assist for lower body dressing: Contact Guard/Touching assist     Toileting Toileting    Toileting assist Assist for toileting: Supervision/Verbal cueing     Transfers Chair/bed transfer  Transfers assist     Chair/bed transfer assist  level: Contact Guard/Touching assist     Locomotion Ambulation   Ambulation assist      Assist level: Contact Guard/Touching assist Assistive device: Walker-rolling Max distance: 150'   Walk 10 feet activity   Assist     Assist level: Contact Guard/Touching assist Assistive device: Walker-rolling   Walk 50 feet activity   Assist Walk 50 feet with 2 turns activity did not occur: Safety/medical concerns  Assist level: Contact Guard/Touching assist Assistive device: Walker-rolling    Walk 150 feet activity   Assist Walk 150 feet activity did not occur: Safety/medical concerns  Assist level: Contact Guard/Touching assist Assistive device: Walker-rolling    Walk 10 feet on uneven surface  activity   Assist Walk 10 feet on uneven surfaces activity did not occur:  Safety/medical concerns         Wheelchair     Assist Is the patient using a wheelchair?: Yes      Wheelchair assist level: Minimal Assistance - Patient > 75% Max wheelchair distance: 167ft    Wheelchair 50 feet with 2 turns activity    Assist        Assist Level: Minimal Assistance - Patient > 75%   Wheelchair 150 feet activity     Assist      Assist Level: Moderate Assistance - Patient 50 - 74%   Blood pressure 123/75, pulse 73, temperature 99.8 F (37.7 C), temperature source Oral, resp. rate 16, height 5\' 9"  (1.753 m), weight 111.2 kg, SpO2 97 %.  Medical Problem List and Plan: 1. 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 non-displaced anterior vertebral body fracture at C7-T1 through osteophyte. Patient may remove c-collar to shower. Will discuss with NSGY duration of collar use.              -ELOS/Goals: 14-20 days, supervision to min assist goals with PT and OT  -Continue CIR therapies including PT and OT. Interdisciplinary team conference today to discuss goals, barriers to discharge, and dc planning.  2.  Antithrombotics: -DVT/anticoagulation:  Pharmaceutical: Heparin             -antiplatelet therapy: none 3. Post-traumatic pain: continue Tramadol 50 mg q6 hours prn for pain 4. Mood: Lcsw to evaluate and provide emotional support             -antipsychotic agents: n/a 5. Neuropsych: This patient is capable of making decisions on his own behalf. 6. Scrotal edema: Routine skin care checks  1/10 scrotal wound re-dressed. Continue keflex 7. Fluids/Electrolytes/Nutrition: routine Is and Os and follow-up chemistries 8: DM-insulin requiring: Hgb A1c = 6.1 --continue SSI>>Novolog 0-15u --continue Metformin -CBG (last 3)  Recent Labs    01/10/22 0603  01/10/22 0637 01/10/22 1157  GLUCAP 123* 109* 91  1/10 -continue with SSI as we address ileus 9. Hypertension: continue Norvasc, HCTZ, Zestril 10: Hyperlipidemia: continue Lipitor 11: Depression: well controlled on Wellbutrin (home med continued)   13: Mild hypokalemia: continue K+ runs and with IVF  14. Colonic ileus:   -GI following, s/p endoscopic decompression -d/c NGT -CT abdomen pelvis shows likely hematoma -today's KUB sl improved    LOS: 5 days A FACE TO FACE EVALUATION WAS PERFORMED  Martha Clan P Amyriah Buras 01/10/2022, 12:45 PM

## 2022-01-10 NOTE — Progress Notes (Signed)
RN placed pt on cpap for the night 

## 2022-01-11 LAB — GLUCOSE, CAPILLARY
Glucose-Capillary: 100 mg/dL — ABNORMAL HIGH (ref 70–99)
Glucose-Capillary: 103 mg/dL — ABNORMAL HIGH (ref 70–99)
Glucose-Capillary: 71 mg/dL (ref 70–99)
Glucose-Capillary: 89 mg/dL (ref 70–99)
Glucose-Capillary: 92 mg/dL (ref 70–99)

## 2022-01-11 MED ORDER — DOCUSATE SODIUM 100 MG PO CAPS
100.0000 mg | ORAL_CAPSULE | Freq: Two times a day (BID) | ORAL | Status: DC
  Administered 2022-01-11 – 2022-01-15 (×8): 100 mg via ORAL
  Filled 2022-01-11 (×8): qty 1

## 2022-01-11 NOTE — Progress Notes (Signed)
Physical Therapy Session Note  Patient Details  Name: Arthur Farrell MRN: [id number] Date of Birth: 05/30/1956  Today's Date: 01/11/2022 PT Individual Time: 8099-8338 PT Individual Time Calculation (min): 38 min   Short Term Goals: Week 1:  PT Short Term Goal 1 (Week 1): STG=LTG due to ELOS  Skilled Therapeutic Interventions/Progress Updates: Pt presented in recliner agreeable to therapy. Pt agreeable to participate in ambulation with shoes. PTA donned shoes total A due to wound. Pt voiced concern with d/c as unsure about wound care and significant scrotal swelling. PTA advised that education will be provided prior to d/c on wound care and nsg continuing to monitor swelling. Pt did state that scrotal sling has helped somewhat today. Pt began ambulation performing Sit to stand from recliner with supervision and ambulated to door. At door pt indicating possible need to have BM. Pt turned and ambulated to toilet. Performed toilet transfer and clothing management with supervision. Pt initially unable to void then when stood and performed peri-care returned to sitting to have successful liquidly BM. Pt stating felt like wound may have possibly dehisced. Upon standing PTA noting some drainage from wound through dressing. Pt then indicated another need for possible BM with more success and significant drainage upon standing. Once completed pt stood again and able to complete peri-care with supervision with PTA assisting removing scrotal sling due to blood. Pt completed clothing management and ambulated to sink to perform hand hygiene. Pt then ambulated to bed, PTA doffed shoes total A and pt returned to supine with supervision. Pt left in bed with bed alarm on, call bell within reach and [place], RN notified of pt's disposition.      Therapy Documentation Precautions:  Precautions Precautions: Fall, Cervical Required Braces or Orthoses: Cervical Brace Cervical Brace: Hard collar, At all times (except for  shower) Restrictions Weight Bearing Restrictions: No General:   Vital Signs: Therapy Vitals Temp: 98.1 F (36.7 C) Temp Source: Oral Pulse Rate: 70 Resp: 17 BP: 127/86 Patient Position (if appropriate): Sitting Oxygen Therapy SpO2: 100 % O2 Device: Room Air Pain: Pain Assessment Pain Score: 0-No pain Mobility:   Locomotion :    Trunk/Postural Assessment :    Balance:   Exercises:   Other Treatments:      Therapy/Group: Individual Therapy  Salam Micucci 01/11/2022, 3:52 PM

## 2022-01-11 NOTE — Progress Notes (Signed)
PROGRESS NOTE   Subjective/Complaints: Had best sleep last night. Tolerated his CPAP well Seen ambulating in hallway with Ladona Ridgelaylor well today Abdominal pain resolved- still no recent BM. Feels he can tolerate a regular diet.   ROS: Patient denies fever, rash, sore throat, blurred vision,   diarrhea, cough, shortness of breath or chest pain, joint or back pain, headache, or mood change. +constipation    Objective:   CT ABDOMEN PELVIS W CONTRAST  Result Date: 01/09/2022 CLINICAL DATA:  Trauma, abdominal pain EXAM: CT ABDOMEN AND PELVIS WITH CONTRAST TECHNIQUE: Multidetector CT imaging of the abdomen and pelvis was performed using the standard protocol following bolus administration of intravenous contrast. CONTRAST:  100mL OMNIPAQUE IOHEXOL 350 MG/ML SOLN COMPARISON:  01/02/2022 FINDINGS: Lower chest: Subtle increased markings are seen in the left lower lung fields suggesting subsegmental atelectasis or scarring. Hepatobiliary: No focal abnormality is seen. Pancreas: Unremarkable. Spleen: Unremarkable. Adrenals/Urinary Tract: Adrenals are not enlarged. There is no hydronephrosis. There is no perinephric fluid collection. There are no renal or ureteral stones. Urinary bladder is unremarkable. There is possible parapelvic cyst in the midportion of left kidney. Stomach/Bowel: Tip of enteric tube is seen in the antrum of the stomach. Small bowel loops are not dilated. Appendix is not dilated. There is no significant wall thickening in colon. There is incomplete distention of descending and sigmoid colon. There is no pericolic stranding. Vascular/Lymphatic: Unremarkable. Reproductive: Prostate is enlarged. There are patchy high attenuation foci in the posterolateral aspects of prostate which was not evident in the previous study. Possibility bleeding within the prostate is not excluded. Other: There is a new large fluid collection with inhomogeneous  attenuation measuring approximately 10.1 x 5.4 cm anterior to the pubic symphysis extending to the right. This complex fluid collection is causing extrinsic pressure over the right lateral margin of the penis. Right pectineus muscle has enlarged in size with foci of low-density, possibly hematoma. Skin staples are seen in the suprapubic region. There are pockets of air in the subcutaneous plane, possibly due to open wound in the skin. Musculoskeletal: Degenerative changes are noted in the lumbar spine with spinal stenosis and encroachment of neural foramina at multiple levels. IMPRESSION: There is interval appearance of large complex partly cystic mass in the subcutaneous plane in the suprapubic region extending more to the right measuring approximately 10.1 cm in maximum diameter. Possibility of hematoma or infectious process should be considered. This complex fluid collection is causing extrinsic pressure over the right lateral margin of base of the penis. There is enlargement of right pectineus muscle containing areas of low attenuation possibly suggesting intramuscular hematoma. There is no demonstrable laceration in the solid organs. There is no ascites or pneumoperitoneum in the abdomen and pelvis. There are new patchy foci of high attenuation in the enlarged prostate. Possibility of bleeding within prostate is not excluded. Other findings as described in the body of the report. Electronically Signed   By: Ernie AvenaPalani  Rathinasamy M.D.   On: 01/09/2022 16:48   Recent Labs    01/10/22 0603  WBC 9.1  HGB 9.8*  HCT 31.3*  PLT 324    Recent Labs    01/08/22 1353  NA 134*  K 3.4*  CL 95*  CO2 29  GLUCOSE 112*  BUN 20  CREATININE 1.05  CALCIUM 9.2    Intake/Output Summary (Last 24 hours) at 01/11/2022 1212 Last data filed at 01/11/2022 0800 Gross per 24 hour  Intake 2335.05 ml  Output 500 ml  Net 1835.05 ml        Physical Exam: Vital Signs Blood pressure (!) 143/78, pulse 66, temperature  98 F (36.7 C), resp. rate 16, height 5\' 9"  (1.753 m), weight 111.2 kg, SpO2 98 %. Gen: no distress, normal appearing HEENT: oral mucosa pink and moist, NCAT Cardio: Reg rate Chest: normal effort, normal rate of breathing Abd: soft, non-distended Ext: no edema Psych: pleasant, normal affect  GU_ ongoing swelling of penis and testicles- slightly better- is elevated on towel Musculoskeletal:        General: LE edema  Skin:    General: Skin is warm and dry.     Comments: Suprapubic laceration with staples-bloody drainage, dressed. Neurological:     Mental Status: He is alert.     Comments: Alert and oriented x 3. Normal insight and awareness. Intact Memory. Normal language and speech. Cranial nerve exam unremarkable.  Right MMT:  3+/5 deltoid, 3+/5 bicep, 3+/5 tricep, 3/5 wrist extension, 3- to 3/5 hand intrinsics.      4 to 4+/5 hip flexor, 4/5 knee extension, 4/5 ankle dorsiflexion, 4/5 ankle plantarflexion. Left MMT: . 4 to 4+/5 deltoid, 4 to 4+/5 bicep, 4/5 tricep, 4/5 wrist extension, 4/5 hand intrinsics.      4+/5 hip flexor, 4+/5 knee extension, 4+/5 ankle dorsiflexion, 4+/5 ankle plantarflexion. Motor exam stable  Assessment/Plan: 1. Functional deficits which require 3+ hours per day of interdisciplinary therapy in a comprehensive inpatient rehab setting. Physiatrist is providing close team supervision and 24 hour management of active medical problems listed below. Physiatrist and rehab team continue to assess barriers to discharge/monitor patient progress toward functional and medical goals  Care Tool:  Bathing    Body parts bathed by patient: Right arm, Left arm, Chest, Abdomen, Front perineal area, Buttocks, Right upper leg, Left upper leg, Right lower leg, Left lower leg, Face         Bathing assist Assist Level: Contact Guard/Touching assist     Upper Body Dressing/Undressing Upper body dressing   What is the patient wearing?: Pull over shirt    Upper body assist  Assist Level: Set up assist    Lower Body Dressing/Undressing Lower body dressing      What is the patient wearing?: Pants     Lower body assist Assist for lower body dressing: Set up assist     Toileting Toileting    Toileting assist Assist for toileting: Supervision/Verbal cueing     Transfers Chair/bed transfer  Transfers assist     Chair/bed transfer assist level: Supervision/Verbal cueing     Locomotion Ambulation   Ambulation assist      Assist level: Contact Guard/Touching assist Assistive device: Walker-rolling Max distance: 150'   Walk 10 feet activity   Assist     Assist level: Contact Guard/Touching assist Assistive device: Walker-rolling   Walk 50 feet activity   Assist Walk 50 feet with 2 turns activity did not occur: Safety/medical concerns  Assist level: Contact Guard/Touching assist Assistive device: Walker-rolling    Walk 150 feet activity   Assist Walk 150 feet activity did not occur: Safety/medical concerns  Assist level: Contact Guard/Touching assist Assistive device: Walker-rolling    Walk 10 feet on uneven surface  activity   Assist Walk 10 feet on uneven surfaces activity did not occur: Safety/medical concerns         Wheelchair     Assist Is the patient using a wheelchair?: Yes      Wheelchair assist level: Minimal Assistance - Patient > 75% Max wheelchair distance: 160ft    Wheelchair 50 feet with 2 turns activity    Assist        Assist Level: Minimal Assistance - Patient > 75%   Wheelchair 150 feet activity     Assist      Assist Level: Moderate Assistance - Patient 50 - 74%   Blood pressure (!) 143/78, pulse 66, temperature 98 F (36.7 C), resp. rate 16, height 5\' 9"  (1.753 m), weight 111.2 kg, SpO2 98 %.  Medical Problem List and Plan: 1. 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 non-displaced anterior vertebral body fracture at C7-T1 through osteophyte. Patient may remove c-collar to shower. Will switch to soft collar             -ELOS/Goals: 14-20 days, supervision to min assist goals with PT and OT  -Continue CIR therapies including PT and OT. Interdisciplinary team conference today to discuss goals, barriers to discharge, and dc planning.  2.  Antithrombotics: -DVT/anticoagulation:  Pharmaceutical: Heparin             -antiplatelet therapy: none 3. Post-traumatic pain: continue Tramadol 50 mg q6 hours prn for pain 4. Mood: Lcsw to evaluate and provide emotional support             -antipsychotic agents: n/a 5. Neuropsych: This patient is capable of making decisions on his own behalf. 6. Scrotal edema: continue scrotal sling.   1/10 scrotal wound re-dressed. Continue keflex 7. Fluids/Electrolytes/Nutrition: routine Is and Os and follow-up chemistries 8: DM-insulin requiring: Hgb A1c = 6.1 --continue SSI>>Novolog 0-15u --continue Metformin -CBG (last 3)  Recent Labs    01/11/22 0007 01/11/22 0608 01/11/22 1142  GLUCAP 100* 103* 89  1/10 -continue with SSI as we address ileus 9. Hypertension: continue Norvasc, HCTZ, Zestril 10: Hyperlipidemia: continue Lipitor 11: Depression: well controlled on Wellbutrin (home med continued)   13: Mild hypokalemia: continue K+ runs and with IVF  14. Colonic ileus:   -GI following, s/p endoscopic decompression -d/c NGT -CT abdomen pelvis shows likely hematoma -KUB improved -advance to regular diet 15. Constipation: start colace 100mg  BID    LOS: 6 days A FACE TO FACE EVALUATION WAS PERFORMED  Tija Biss P Amery Minasyan 01/11/2022, 12:12 PM

## 2022-01-11 NOTE — Progress Notes (Signed)
Occupational Therapy Session Note  Patient Details  Name: Arthur Farrell MRN: [id number] Date of Birth: 1956-02-20  Today's Date: 01/11/2022 OT Individual Time: 0800-0830 OT Individual Time Calculation (min): 30 min    Short Term Goals: Week 1:  OT Short Term Goal 1 (Week 1): STGs=LTGs due to ELOS  Skilled Therapeutic Interventions/Progress Updates:  Pt greeted  seated in recliner agreeable to OT intervention. Session focus on BADL reeducation, functional mobility, dynamic standing balance and decreasing overall caregiver burden.  Pt completed dressing from recliner with s/u assist to don OH shirt and pants. Pt completed functional mobility from room to gym with rw and CGA. Pt completed dynamic standing balance task with pt reaching out of BOS to retrieve horseshoes and tossing to target. Pt completed task with supervision. Pt ambulated back to room with CGA where pt left up in recliner with all needs within reach. Switched out RW as pt c/o R wheel being shaky.                       Therapy Documentation Precautions:  Precautions Precautions: Fall, Cervical Required Braces or Orthoses: Cervical Brace Cervical Brace: Hard collar, At all times (except for shower) Restrictions Weight Bearing Restrictions: No  Pain: no pain reported during session    Therapy/Group: Individual Therapy  Corinne [facility] 01/11/2022, 12:09 PM

## 2022-01-11 NOTE — Progress Notes (Signed)
Patient rested well throughout the night. C/o pain after awakening, medication administered was not effective. Follow-up with another PRN tylenol. Dressing on groin cleansed and changed, new dry gauze packed and placed. Patient now sitting in chair eating breakfast.

## 2022-01-11 NOTE — Progress Notes (Signed)
Physical Therapy Session Note  Patient Details  Name: Arthur Farrell MRN: [id number] Date of Birth: 03-Nov-1956  Today's Date: 01/11/2022 PT Individual Time: 1030-1140 PT Individual Time Calculation (min): 70 min   Short Term Goals: Week 1:  PT Short Term Goal 1 (Week 1): STG=LTG due to ELOS  Skilled Therapeutic Interventions/Progress Updates:    Pt received seated in recliner in room, agreeable to PT session. Pt reports 3/10 pain at site of suprapubic laceration and 5/10 pain in his R posterior shoulder region. Pt also has onset of R lower back pain with mobility. Pt reports being premedicated for pain prior to start of therapy session, utilized stretching/repositioning and distraction techniques during session. Sit to stand with Supervision both with and without RW during session. Ambulation up to 150 ft with RW at Supervision level during session. Trial gait with no AD initially with CGA due to some path deviation and mild instability, progresses to close Supervision for gait with no AD during session. Ascend/descend 12 x 6" stairs with 2 handrails at Supervision level, alternating gait pattern. Sit to/from supine on real bed in therapy apartment at Supervision level. Pt then taken to rehab kitchen and worked on reaching into lower cabinets to retrieve items. Pt has onset of lower back pain with reaching into lower cabinets. Discussed placing items on countertops or easier to reach cabinets, especially frequently used items. Pt understanding of education and reports his kitchen is already setup for the most part so that he can retrieve items easily. Also worked on retrieving dishes from the cabinets and carrying to/from kitchen table to simulate carrying items without use of an AD in the home. Pt is at close Supervision for balance while carrying items during gait. Patient demonstrates increased fall risk as noted by score of 30/56 on Berg Balance Scale.  (<36= high risk for falls, close to 100%; 37-45  significant >80%; 46-51 moderate >50%; 52-55 lower >25%). Reviewed score and functional implications. 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 Static standing balance with no AD performing ball toss with focus on use of RUE>LUE, 2 x 30 reps to fatigue. Pt returned to room and left seated in recliner in care of NT at end of session.  Therapy Documentation Precautions:  Precautions Precautions: Fall, Cervical Required Braces or Orthoses: Cervical Brace Cervical Brace: Hard collar, At all times (except for shower) Restrictions Weight Bearing Restrictions: No Standardized Balance Assessment Standardized Balance Assessment: Berg Balance Test Berg Balance Test Sit to Stand: Able to stand without using hands and stabilize independently Standing Unsupported: Able to stand 2 minutes with supervision Sitting with Back Unsupported but Feet Supported on Floor or Stool: Able to sit safely and securely 2 minutes Stand to Sit: Uses backs of legs against chair to control descent Transfers: Able to transfer with verbal cueing and /or supervision Standing Unsupported with Eyes Closed: Able to stand 10 seconds with supervision Standing Ubsupported with Feet Together: Able to place feet together independently and stand for 1 minute with supervision From Standing, Reach Forward with Outstretched Arm: Reaches forward but needs supervision From Standing Position, Pick up Object from Floor: Unable to try/needs assist to keep balance (min A for balance) From Standing Position, Turn to Look Behind Over each Shoulder: Needs supervision when turning Turn 360 Degrees: Able to turn 360 degrees safely but slowly Standing Unsupported, Alternately Place Feet on Step/Stool: Able to complete 4 steps without aid or supervision  Standing Unsupported, One Foot in Front: Able to take small step independently and hold 30 seconds Standing on One Leg:  Tries to lift leg/unable to hold 3 seconds but remains standing independently Total Score: 30    Therapy/Group: Individual Therapy   Excell Seltzer, PT, DPT, CSRS  01/11/2022, 1:38 PM

## 2022-01-11 NOTE — Progress Notes (Signed)
Patient ID: Arthur Farrell, male   DOB: [birth year], 66 y.o.   MRN: [id number]  SW confirms faxed received for outpatient PT/OT at [facility] (p:[phone]/f:[phone]).  Cecile Sheerer, MSW, LCSWA Office: [phone] Cell: [phone] Fax: [phone]

## 2022-01-11 NOTE — Progress Notes (Signed)
Occupational Therapy Session Note  Patient Details  Name: Arthur Farrell MRN: [id number] Date of Birth: 11/15/1956  Today's Date: 01/11/2022 OT Individual Time: 2694-8546 OT Individual Time Calculation (min): 60 min    Short Term Goals: Week 1:  OT Short Term Goal 1 (Week 1): STGs=LTGs due to ELOS  Skilled Therapeutic Interventions/Progress Updates:    Pt resting in recliner upon arrival. Pt requested to shave this morning. Amb in room with RW-supervision. Pt alternated between sitting and standing at sink to complete shaving tasks. Pt commented that using his arms for extended periods made him short of breath and he needed to take rest breaks. Pt provided with dycem to use to assist with opening containers. Pt also completed UB bathing tasks at sink. All tasks with supervision. Pt returned to recliner. All needs within reach.   Therapy Documentation Precautions:  Precautions Precautions: Fall, Cervical Required Braces or Orthoses: Cervical Brace Cervical Brace: Hard collar, At all times (except for shower) Restrictions Weight Bearing Restrictions: No  Pain: Pain Assessment Pain Scale: 0-10 Pain Score: 0-No pain Pain Type: Acute pain Pain Location: Neck Pain Orientation: Posterior Pain Descriptors / Indicators: Aching;Throbbing Pain Frequency: Constant Pain Onset: Awakened from sleep Patients Stated Pain Goal: 2 Pain Intervention(s): meds admin prior to therapy Multiple Pain Sites: Yes 2nd Pain Site Pain Score: 5 Pain Type: Acute pain Pain Location: Groin Pain Orientation: Medial Pain Descriptors / Indicators: Aching Pain Onset: On-going Patient's Stated Pain Goal: 2 Pain Intervention(s): meds admin prior to therapy   Therapy/Group: Individual Therapy  [name] 01/11/2022, 10:21 AM

## 2022-01-11 NOTE — Discharge Instructions (Addendum)
Inpatient Rehab Discharge Instructions  Arthur Farrell Discharge date and time: 01/15/2022  Activities/Precautions/ Functional Status: Activity: no lifting, driving, or strenuous exercise for until cleared by MD Diet: diabetic diet Wound Care: See instructions below. Call [facility] surgery for increased pain, fever or concerns. Functional status:  ___ No restrictions     ___ Walk up steps independently ___ 24/7 supervision/assistance   ___ Walk up steps with assistance ___ Intermittent supervision/assistance  ___ Bathe/dress independently ___ Walk with walker     __x_ Bathe/dress with assistance ___ Walk Independently    ___ Shower independently ___ Walk with assistance    ___ Shower with assistance _x__ No alcohol     ___ Return to work/school ________  Special Instructions:  No driving, alcohol consumption or tobacco use.   Please bring this form and your medication list with you to all your follow-up doctor's appointments.    Call your gastroenterologist for post hospital follow up as soon as possible.   COMMUNITY REFERRALS UPON DISCHARGE:     Outpatient: PT      OT                  Agency:[facility]/[place] location  Phone:[phone]              Appointment Date/Time:*Please expect follow-up within 7-10 business days to schedule your appointment. If you have not received follow-up, be sure to contact the site directly.*  My questions have been answered and I understand these instructions. I will adhere to these goals and the provided educational materials after my discharge from the hospital.  Patient/Caregiver Signature _______________________________ Date __________  Clinician Signature _______________________________________ Date __________  Medical Equipment/Items Ordered: rolling walker, tub transfer bench, and 3in1 bedside commode                                                 [facility]   MIDLINE WOUND  CARE: - midline dressing to be changed daily - supplies: sterile saline, kerlix, scissors, ABD pads, tape  - remove dressing and all packing carefully, moistening with sterile saline as needed to avoid packing/internal dressing sticking to the wound. - clean edges of skin around the wound with water/gauze, making sure there is no tape debris or leakage left on skin that could cause skin irritation or breakdown. - dampen and clean kerlix with sterile saline and pack wound from wound base to skin level, making sure to take note of any possible areas of wound tracking, tunneling and packing appropriately. Wound can be packed loosely. Trim kerlix to size if a whole kerlix is not required. - cover wound with a dry ABD pad and secure with tape.  - write the date/time on the dry dressing/tape to better track when the last dressing change occurred. - apply any skin protectant/powder recommended by clinician to protect skin/skin folds. - change dressing as needed if leakage occurs, wound gets contaminated, or patient requests to shower. - patient may shower daily with wound open and following the shower the wound should be dried and a clean dressing placed.

## 2022-01-11 NOTE — Progress Notes (Signed)
RT note: Patient states he does not want to wear the cpap tonight.

## 2022-01-12 LAB — BASIC METABOLIC PANEL
Anion gap: 9 (ref 5–15)
BUN: 12 mg/dL (ref 8–23)
CO2: 26 mmol/L (ref 22–32)
Calcium: 8.8 mg/dL — ABNORMAL LOW (ref 8.9–10.3)
Chloride: 102 mmol/L (ref 98–111)
Creatinine, Ser: 0.97 mg/dL (ref 0.61–1.24)
GFR, Estimated: >60 mL/min (ref >60)
Glucose, Bld: 109 mg/dL — ABNORMAL HIGH (ref 70–99)
Potassium: 3.3 mmol/L — ABNORMAL LOW (ref 3.5–5.1)
Sodium: 137 mmol/L (ref 135–145)

## 2022-01-12 LAB — GLUCOSE, CAPILLARY
Glucose-Capillary: 110 mg/dL — ABNORMAL HIGH (ref 70–99)
Glucose-Capillary: 94 mg/dL (ref 70–99)
Glucose-Capillary: 97 mg/dL (ref 70–99)

## 2022-01-12 LAB — CBC
HCT: 32.5 % — ABNORMAL LOW (ref 39.0–52.0)
Hemoglobin: 10.4 g/dL — ABNORMAL LOW (ref 13.0–17.0)
MCH: 19.8 pg — ABNORMAL LOW (ref 26.0–34.0)
MCHC: 32 g/dL (ref 30.0–36.0)
MCV: 61.8 fL — ABNORMAL LOW (ref 80.0–100.0)
Platelets: 380 10*3/uL (ref 150–400)
RBC: 5.26 MIL/uL (ref 4.22–5.81)
RDW: 16.1 % — ABNORMAL HIGH (ref 11.5–15.5)
WBC: 9.9 10*3/uL (ref 4.0–10.5)
nRBC: 0.2 % (ref 0.0–0.2)

## 2022-01-12 MED ORDER — METFORMIN HCL 500 MG PO TABS
250.0000 mg | ORAL_TABLET | Freq: Every day | ORAL | Status: DC
  Administered 2022-01-13 – 2022-01-15 (×3): 250 mg via ORAL
  Filled 2022-01-12 (×3): qty 1

## 2022-01-12 MED ORDER — POTASSIUM CHLORIDE 20 MEQ PO PACK
40.0000 meq | PACK | Freq: Two times a day (BID) | ORAL | Status: AC
  Administered 2022-01-12 (×2): 40 meq via ORAL
  Filled 2022-01-12 (×2): qty 2

## 2022-01-12 MED ORDER — FUROSEMIDE 20 MG PO TABS
10.0000 mg | ORAL_TABLET | Freq: Every day | ORAL | Status: DC
  Administered 2022-01-12 – 2022-01-15 (×4): 10 mg via ORAL
  Filled 2022-01-12 (×4): qty 1

## 2022-01-12 NOTE — Progress Notes (Signed)
Physical Therapy Session Note  Patient Details  Name: Arthur Farrell MRN: [id number] Date of Birth: Jan 01, 1956  Today's Date: 01/12/2022 PT Individual Time: 1400-1426 PT Individual Time Calculation (min): 26 min   Short Term Goals: Week 1:  PT Short Term Goal 1 (Week 1): STG=LTG due to ELOS  Skilled Therapeutic Interventions/Progress Updates: Pt presented in recliner agreeable to therapy. Pt denies pain and states wound is feeling better than yesterday. Session focus on ambulation in community environment in preparation for d/c. Pt transferred to w/c and transported to [facility] entrance. Pt ambulated with CGA fading to supervision through [facility] patio and valet area. Pt demonstrated overall good safety with RW management and ambulation on uneven surfaces. Pt ambulated >34ft with RW with x 1 seated rest at picnic bench. Pt demonstrated good safety with transfers. Once completed pt transported back to room via w/c and in room requesting to use bathroom. Performed ambulatory transfer to bathroom with supervision for toilet transfers. Pt left at toilet with pt verbalizing understanding for calling nsg prior to getting up and Apolinar Junes, RN notified of pt's disposition.      Therapy Documentation Precautions:  Precautions Precautions: Fall, Cervical Required Braces or Orthoses: Cervical Brace Cervical Brace: Hard collar, At all times (except for shower) Restrictions Weight Bearing Restrictions: No General:   Vital Signs: Therapy Vitals Temp: (!) 100.5 F (38.1 C) Temp Source: Oral Pulse Rate: 85 Resp: 20 BP: (!) 146/89 Patient Position (if appropriate): Sitting Oxygen Therapy SpO2: 99 % O2 Device: Room Air Pain:   Mobility:   Locomotion :    Trunk/Postural Assessment : Cervical Assessment Cervical Assessment: Exceptions to [facility] (cervica collar) Thoracic Assessment Thoracic Assessment: Exceptions to [facility] (rounded shoulders) Lumbar Assessment Lumbar Assessment: Within Functional  Limits Postural Control Postural Control: Within Functional Limits  Balance: Static Sitting Balance Static Sitting - Balance Support: Feet supported Static Sitting - Level of Assistance: 7: Independent Dynamic Sitting Balance Dynamic Sitting - Balance Support: During functional activity Dynamic Sitting - Level of Assistance: 6: Modified independent (Device/Increase time) Exercises:   Other Treatments:      Therapy/Group: Individual Therapy  Rexford Prevo 01/12/2022, 3:18 PM

## 2022-01-12 NOTE — Progress Notes (Signed)
Occupational Therapy Session Note  Patient Details  Name: Rubin Dais MRN: [id number] Date of Birth: 1956-07-10  Today's Date: 01/12/2022 OT Individual Time: 1100-1155 OT Individual Time Calculation (min): 55 min    Short Term Goals: Week 1:  OT Short Term Goal 1 (Week 1): STGs=LTGs due to ELOS  Skilled Therapeutic Interventions/Progress Updates:    OT intervention with focus on family (cousins)education, education on donning sling for scrotum, dressing tasks, TTB transfers, energy conservation, and safety awareness. TTB transfers with supervisoin. Dressing with supervision. Amb in room and hallway with supervision. Discussed energy conservation strategies. Recommended no shower until cleared by MD. Pt and family verbalized understanding of all recommendations and demonstrated appropriate level of assistance/supervisoin. Pt remained in recliner with all needs within reach.  Therapy Documentation Precautions:  Precautions Precautions: Fall, Cervical Required Braces or Orthoses: Cervical Brace Cervical Brace: Hard collar, At all times (except for shower) Restrictions Weight Bearing Restrictions: No   Pain: Pt reports that Bil shoulder stiffness has improved since earlier session  Therapy/Group: Individual Therapy  [name] 01/12/2022, 12:22 PM

## 2022-01-12 NOTE — Plan of Care (Signed)
°  Problem: RH Wheelchair Mobility Goal: LTG Patient will propel w/c in controlled environment (PT) Description: LTG: Patient will propel wheelchair in controlled environment, # of feet with assist (PT) Flowsheets (Taken 01/12/2022 0949) LTG: Pt will propel w/c in controlled environ  assist needed:: (d/c goal due to progress) -- Note: D/c goal due to progress
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
## 2022-01-12 NOTE — Progress Notes (Signed)
Occupational Therapy Discharge Summary  Patient Details  Name: Arthur Farrell MRN: [id number] Date of Birth: 1956-03-29  Patient has met 101 of 18 long term goals due to improved activity tolerance, improved balance, ability to compensate for deficits, functional use of  RIGHT upper extremity, and improved coordination.  Pt made steady progress with BADLs, IADLs, toileting, and functional transers during this admission, despite medical issues. Pt with significant edemal of scrotum requiring use of sling for support and compression. Pt's cousins have been present for education with donning sling and functional status. Pt is mod I for bathing/dressing and simple home mgmt tasks. Pt is mod I for all functional transfers and toileting tasks. Patient to discharge at overall Modified Independent level.  Patient's care partner is independent to provide the necessary assistance at discharge.     Recommendation:  Patient will benefit from ongoing skilled OT services in outpatient setting to continue to advance functional skills in the area of iADL.  Equipment: BSC and TTB ordered through [place]  Reasons for discharge: treatment goals met and discharge from hospital  Patient/family agrees with progress made and goals achieved: Yes  OT Discharge Vision Baseline Vision/History: 0 No visual deficits Patient Visual Report: No change from baseline Vision Assessment?: No apparent visual deficits Perception  Perception: Within Functional Limits Praxis Praxis: Intact Cognition Overall Cognitive Status: Within Functional Limits for tasks assessed Arousal/Alertness: Awake/alert Year: 2023 Month: January Day of Week: Correct Attention: Focused;Sustained Focused Attention: Appears intact Sustained Attention: Appears intact Memory: Appears intact Immediate Memory Recall: Sock;Bed;Blue Memory Recall Sock: Without Cue Memory Recall Blue: Without Cue Memory Recall Bed: Without Cue Awareness: Appears  intact Problem Solving: Appears intact Safety/Judgment: Appears intact Sensation Sensation Light Touch: Impaired by gross assessment Hot/Cold: Appears Intact Proprioception: Appears Intact Stereognosis: Appears Intact Coordination Gross Motor Movements are Fluid and Coordinated: Yes Fine Motor Movements are Fluid and Coordinated: Yes Finger Nose Finger Test: WNL Motor  Motor Motor: Other (comment) Motor - Skilled Clinical Observations: UE weakness from cervical fx.    Trunk/Postural Assessment  Cervical Assessment Cervical Assessment: Exceptions to [facility] (cervica collar) Thoracic Assessment Thoracic Assessment: Exceptions to [facility] (rounded shoulders) Lumbar Assessment Lumbar Assessment: Within Functional Limits Postural Control Postural Control: Within Functional Limits  Balance Static Sitting Balance Static Sitting - Balance Support: Feet supported Static Sitting - Level of Assistance: 7: Independent Dynamic Sitting Balance Dynamic Sitting - Balance Support: During functional activity Dynamic Sitting - Level of Assistance: 6: Modified independent (Device/Increase time) Extremity/Trunk Assessment RUE Assessment Passive Range of Motion (PROM) Comments: WNL Active Range of Motion (AROM) Comments: AROM ~110 degrees General Strength Comments: 4/5 LUE Assessment LUE Assessment: Within Functional Limits

## 2022-01-12 NOTE — Progress Notes (Signed)
Orthopedic Tech Progress Note Patient Details:  Arthur Farrell 1956-06-10 [id number]  Ortho Devices Type of Ortho Device: Soft collar Ortho Device/Splint Location: neck Ortho Device/Splint Interventions: Ordered   Post Interventions Patient Tolerated: Other (comment) Instructions Provided: Other (comment)  [name] 01/12/2022, 11:48 PM

## 2022-01-12 NOTE — Progress Notes (Signed)
Physical Therapy Discharge Summary  Patient Details  Name: Arthur Farrell MRN: [id number] Date of Birth: [birth year]  Today's Date: 01/14/2022 PT Individual Time: 1000-1103 PT Individual Time Calculation (min): 63 min    Patient has met 10 of 10 long term goals due to improved activity tolerance, improved balance, improved postural control, increased strength, decreased pain, and ability to compensate for deficits.  Patient to discharge at an ambulatory level Modified Independent.   Patient's care partner is independent to provide the necessary physical assistance at discharge. Pt's cousins have completed hands-on family education and are safe to provide any assistance that pt may require upon d/c home.  Reasons goals not met: NA  Recommendation:  Patient will benefit from ongoing skilled PT services in outpatient setting to continue to advance safe functional mobility, address ongoing impairments in endurance, strength, balance, pain management, safety, and minimize fall risk.  Equipment: RW  Reasons for discharge: treatment goals met and discharge from hospital  Patient/family agrees with progress made and goals achieved: Yes  Skilled Therapeutic Interventions/Progress Updates: Pt received in recliner and agreeable to therapy.  No complaint of pain. Pt performed transfers and ambulated with supervision, no AD throughout session. Car transfer with supervision and discussion of appropriate techniques.  Pt then navigated ramp, curb, and mulch with supervision. MMT and berg performed as documented below. Pt then directed in stair navigation with 1 handrail x 12 to mimic home environment, supervision. At this time pt requested to use the bathroom. Returned to room and did so, mod I for 3/3 toileting tasks. After completing hand hygiene, pt had another urge and repeated this process. Pt then requested to go outside for therapeutic benefit of sunlight. Pt ambulated to [facility] entrance with supervision  and no AD, no noted LOB. Sat on bench for several minutes to recover and then returned to room. Pt returned to recliner and remained with all needs in reach.   PT Discharge Precautions/Restrictions Precautions Precautions: Fall;Cervical Precaution Booklet Issued: No Precaution Comments: reviewed spinal precautions during mobility Required Braces or Orthoses: Cervical Brace Cervical Brace: Soft collar Restrictions Weight Bearing Restrictions: No  Pain Pain Assessment Pain Scale: 0-10 Pain Score: 0-No pain Pain Type: Acute pain Pain Location: Shoulder Pain Orientation: Left Pain Descriptors / Indicators: Dull Pain Frequency: Intermittent Patients Stated Pain Goal: 0 Pain Intervention(s): Medication (See eMAR) 2nd Pain Site Pain Score: 5 Pain Type: Acute pain Pain Location: Scrotum Pain Descriptors / Indicators: Sharp Pain Frequency: Intermittent Patient's Stated Pain Goal: 2 Pain Intervention(s): Medication (See eMAR) Pain Interference Pain Interference Pain Effect on Sleep: 2. Occasionally Pain Interference with Therapy Activities: 1. Rarely or not at all Pain Interference with Day-to-Day Activities: 3. Frequently Vision/Perception  Vision - History Ability to See in Adequate Light: 0 Adequate Perception Perception: Within Functional Limits Praxis Praxis: Intact  Cognition Overall Cognitive Status: Within Functional Limits for tasks assessed Arousal/Alertness: Awake/alert Orientation Level: Oriented X4 Year: 2023 Month: January Day of Week: Correct Attention: Focused;Sustained Focused Attention: Appears intact Sustained Attention: Appears intact Memory: Appears intact Sensation Sensation Light Touch: Impaired by gross assessment Central sensation comments: mild C4 dermatome Numbness with parasthesia with testing, improved from baseline Hot/Cold: Appears Intact Proprioception: Appears Intact Stereognosis: Appears Intact Motor  Motor Motor: Other  (comment) Motor - Skilled Clinical Observations: UE weakness from cervical fx. Motor - Discharge Observations: improved fdrom baseline  Mobility Bed Mobility Bed Mobility: Rolling Right;Rolling Left Rolling Right: Independent with assistive device Rolling Left: Independent with assistive device Transfers Transfers: Sit to Stand;Stand  Pivot Transfers Sit to Stand: Independent with assistive device Stand Pivot Transfers: Independent with assistive device Stand Pivot Transfer Details: Verbal cues for gait pattern;Verbal cues for safe use of DME/AE;Verbal cues for precautions/safety Transfer (Assistive device): Rolling walker Locomotion  Gait Ambulation: Yes Gait Assistance: Supervision/Verbal cueing Gait Distance (Feet): 200 Feet Assistive device: None Gait Gait: Yes Gait Pattern: Impaired Gait Pattern: Decreased stride length;Wide base of support Gait velocity: decr Stairs / Additional Locomotion Stairs: Yes Stair Management Technique: Two rails Number of Stairs: 12 Height of Stairs: 6 Ramp: Supervision/Verbal cueing Curb: Contact Guard/Touching assist Wheelchair Mobility Wheelchair Mobility: No  Trunk/Postural Assessment  Cervical Assessment Cervical Assessment: Exceptions to [facility] Thoracic Assessment Thoracic Assessment: Within Functional Limits Lumbar Assessment Lumbar Assessment: Within Functional Limits Postural Control Postural Control: Within Functional Limits  Balance Balance Balance Assessed: Yes Berg Balance Test Sit to Stand: Able to stand without using hands and stabilize independently Standing Unsupported: Able to stand safely 2 minutes Sitting with Back Unsupported but Feet Supported on Floor or Stool: Able to sit safely and securely 2 minutes Stand to Sit: Sits safely with minimal use of hands Transfers: Able to transfer safely, minor use of hands Standing Unsupported with Eyes Closed: Able to stand 10 seconds safely Standing Ubsupported with Feet  Together: Able to place feet together independently and stand 1 minute safely From Standing, Reach Forward with Outstretched Arm: Can reach confidently >25 cm (10") From Standing Position, Pick up Object from Floor: Able to pick up shoe, needs supervision From Standing Position, Turn to Look Behind Over each Shoulder: Turn sideways only but maintains balance Turn 360 Degrees: Able to turn 360 degrees safely in 4 seconds or less Standing Unsupported, Alternately Place Feet on Step/Stool: Able to stand independently and safely and complete 8 steps in 20 seconds Standing Unsupported, One Foot in Front: Able to plae foot ahead of the other independently and hold 30 seconds Standing on One Leg: Able to lift leg independently and hold equal to or more than 3 seconds Total Score: 50 Static Sitting Balance Static Sitting - Balance Support: Feet supported Static Sitting - Level of Assistance: 7: Independent Dynamic Sitting Balance Dynamic Sitting - Balance Support: During functional activity Dynamic Sitting - Level of Assistance: 6: Modified independent (Device/Increase time) Static Standing Balance Static Standing - Balance Support: No upper extremity supported Static Standing - Level of Assistance: 6: Modified independent (Device/Increase time) Dynamic Standing Balance Dynamic Standing - Balance Support: During functional activity Dynamic Standing - Level of Assistance: 6: Modified independent (Device/Increase time) Extremity Assessment      RLE Assessment RLE Assessment: Within Functional Limits General Strength Comments: grossly 4+/5 to 5/5 proximal to distal LLE Assessment LLE Assessment: Within Functional Limits General Strength Comments: grossly 4+/5 to 5/5 proximal to distal.    Aristides Luckey C Tatayana Beshears 01/14/2022, 10:39 AM

## 2022-01-12 NOTE — Progress Notes (Signed)
Occupational Therapy Session Note  Patient Details  Name: Arthur Farrell MRN: [id number] Date of Birth: 1956-11-09  Today's Date: 01/12/2022 OT Individual Time: 0277-4128 OT Individual Time Calculation (min): 73 min    Short Term Goals: Week 1:  OT Short Term Goal 1 (Week 1): STGs=LTGs due to ELOS  Skilled Therapeutic Interventions/Progress Updates:    Pt resting in recliner upon arrival and ready to "get going." Pt requested to take a shower but unable 2/2 suprapubic wound. Bathing/dressing sit<>stand from chair at sink. Pt required assistance donning sling for scrotum. All bathing/dressing tasks with supervisoin/mod I. Amb in room with RW at supervision level. Pt easily "winded" during BADLs and required multiple rest breaks. Pt requested use of toilet and amb with RW to bathroom. Toileting with supervision. Pt remained seatedl in recliner with all needs within reach.  Therapy Documentation Precautions:  Precautions Precautions: Fall, Cervical Required Braces or Orthoses: Cervical Brace Cervical Brace: Hard collar, At all times (except for shower) Restrictions Weight Bearing Restrictions: No   Pain: Pt c/o Bil shoulder stiffness; activity   Therapy/Group: Individual Therapy  [name] 01/12/2022, 9:33 AM

## 2022-01-12 NOTE — Progress Notes (Addendum)
PROGRESS NOTE   Subjective/Complaints: Says he felt a little rough yesterday after ambulating with Lovena Le- was seen ambulating very well, but Lovena Le notes sweating afterward and patient felt malaise. CBG was 72 and he says this is low for him  ROS: Patient denies fever, rash, sore throat, blurred vision,   diarrhea, cough, shortness of breath or chest pain, joint or back pain, headache, or mood change. +constipation, +malaise    Objective:   No results found. Recent Labs    01/10/22 0603 01/12/22 0510  WBC 9.1 9.9  HGB 9.8* 10.4*  HCT 31.3* 32.5*  PLT 324 380    Recent Labs    01/12/22 0510  NA 137  K 3.3*  CL 102  CO2 26  GLUCOSE 109*  BUN 12  CREATININE 0.97  CALCIUM 8.8*    Intake/Output Summary (Last 24 hours) at 01/12/2022 1247 Last data filed at 01/11/2022 2040 Gross per 24 hour  Intake 600 ml  Output 700 ml  Net -100 ml        Physical Exam: Vital Signs Blood pressure 127/82, pulse 76, temperature 98.8 F (37.1 C), temperature source Oral, resp. rate 18, height 5\' 9"  (1.753 m), weight 111.2 kg, SpO2 96 %. Gen: no distress, normal appearing HEENT: oral mucosa pink and moist, NCAT Cardio: Reg rate Chest: normal effort, normal rate of breathing Abd: soft, non-distended Ext: no edema Psych: pleasant, normal affect Skin: intact  GU_ ongoing swelling of penis and testicles- slightly better- is elevated on towel Musculoskeletal:        General: LE edema  Skin:    General: Skin is warm and dry.     Comments: Suprapubic laceration with staples-bloody drainage, dressed. Neurological:     Mental Status: He is alert.     Comments: Alert and oriented x 3. Normal insight and awareness. Intact Memory. Normal language and speech. Cranial nerve exam unremarkable.  Right MMT:  3+/5 deltoid, 3+/5 bicep, 3+/5 tricep, 3/5 wrist extension, 3- to 3/5 hand intrinsics.      4 to 4+/5 hip flexor, 4/5 knee  extension, 4/5 ankle dorsiflexion, 4/5 ankle plantarflexion. Left MMT: . 4 to 4+/5 deltoid, 4 to 4+/5 bicep, 4/5 tricep, 4/5 wrist extension, 4/5 hand intrinsics.      4+/5 hip flexor, 4+/5 knee extension, 4+/5 ankle dorsiflexion, 4+/5 ankle plantarflexion. Motor exam stable  Assessment/Plan: 1. Functional deficits which require 3+ hours per day of interdisciplinary therapy in a comprehensive inpatient rehab setting. Physiatrist is providing close team supervision and 24 hour management of active medical problems listed below. Physiatrist and rehab team continue to assess barriers to discharge/monitor patient progress toward functional and medical goals  Care Tool:  Bathing    Body parts bathed by patient: Right arm, Left arm, Chest, Abdomen, Front perineal area, Buttocks, Right upper leg, Left upper leg, Right lower leg, Left lower leg, Face         Bathing assist Assist Level: Supervision/Verbal cueing     Upper Body Dressing/Undressing Upper body dressing   What is the patient wearing?: Pull over shirt, Orthosis Orthosis activity level: Performed by patient  Upper body assist Assist Level: Set up assist    Lower  Body Dressing/Undressing Lower body dressing      What is the patient wearing?: Pants, Underwear/pull up     Lower body assist Assist for lower body dressing: Set up assist     Toileting Toileting    Toileting assist Assist for toileting: Supervision/Verbal cueing     Transfers Chair/bed transfer  Transfers assist     Chair/bed transfer assist level: Supervision/Verbal cueing     Locomotion Ambulation   Ambulation assist      Assist level: Supervision/Verbal cueing Assistive device: No Device Max distance: 200'   Walk 10 feet activity   Assist     Assist level: Supervision/Verbal cueing Assistive device: No Device   Walk 50 feet activity   Assist Walk 50 feet with 2 turns activity did not occur: Safety/medical concerns  Assist  level: Supervision/Verbal cueing Assistive device: No Device    Walk 150 feet activity   Assist Walk 150 feet activity did not occur: Safety/medical concerns  Assist level: Supervision/Verbal cueing Assistive device: No Device    Walk 10 feet on uneven surface  activity   Assist Walk 10 feet on uneven surfaces activity did not occur: Safety/medical concerns         Wheelchair     Assist Is the patient using a wheelchair?: Yes      Wheelchair assist level: Minimal Assistance - Patient > 75% Max wheelchair distance: 135ft    Wheelchair 50 feet with 2 turns activity    Assist        Assist Level: Minimal Assistance - Patient > 75%   Wheelchair 150 feet activity     Assist      Assist Level: Moderate Assistance - Patient 50 - 74%   Blood pressure 127/82, pulse 76, temperature 98.8 F (37.1 C), temperature source Oral, resp. rate 18, height 5\' 9"  (1.753 m), weight 111.2 kg, SpO2 96 %.  Medical Problem List and Plan: 1. 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 non-displaced anterior vertebral body fracture at C7-T1 through osteophyte. Patient may remove c-collar to shower. Will switch to soft collar             -ELOS/Goals: 14-20 days, supervision to min assist goals with PT and OT  -Continue CIR therapies including PT and OT. Interdisciplinary team conference today to discuss goals, barriers to discharge, and dc planning.  2.  Antithrombotics: -DVT/anticoagulation:  Pharmaceutical: Heparin             -antiplatelet therapy: none 3. Post-traumatic pain: continue Tramadol 50 mg q6 hours prn for pain 4. Mood: Lcsw to evaluate and provide emotional support             -antipsychotic agents: n/a 5. Neuropsych: This patient is capable of making decisions on his  own behalf. 6. Scrotal edema: continue scrotal sling. Add lasix 10mg  daily. Can uptitrate if swelling persists and patient not disturbed by increased urination. Cr normal.   1/10 scrotal wound re-dressed. Continue keflex 7. Fluids/Electrolytes/Nutrition: routine Is and Os and follow-up chemistries 8: DM-insulin requiring: Hgb A1c = 6.1 --restart metformin 250mg  daily as per patient's request. D/c ISS -CBG (last 3)  Recent Labs    01/12/22 0007 01/12/22 0637 01/12/22 1155  GLUCAP 97 110* 94  1/10 -continue with SSI as  we address ileus 9. Hypertension: continue Norvasc, HCTZ, Zestril 10: Hyperlipidemia: continue Lipitor 11: Depression: well controlled on Wellbutrin (home med continued)   13: Mild hypokalemia: continue K+ runs and with IVF  14. Colonic ileus:   -GI following, s/p endoscopic decompression -d/c NGT -CT abdomen pelvis shows likely hematoma -KUB improved -advance to regular diet 15. Constipation: start colace 100mg  BID 16. Hypokalemia: supplemented 1/13.     LOS: 7 days A FACE TO FACE EVALUATION WAS PERFORMED  Clide Deutscher Arynn Armand 01/12/2022, 12:47 PM

## 2022-01-12 NOTE — Progress Notes (Signed)
Physical Therapy Session Note  Patient Details  Name: Arthur Farrell MRN: [id number] Date of Birth: 06/16/1956  Today's Date: 01/12/2022 PT Individual Time: 1000-1055 PT Individual Time Calculation (min): 55 min   Short Term Goals: Week 1:  PT Short Term Goal 1 (Week 1): STG=LTG due to ELOS  Skilled Therapeutic Interventions/Progress Updates:    Pt received seated in recliner in room, agreeable to PT session. Pt reports ongoing pain in posterior R shoulder, premedicated prior to start of therapy session. Discussed use of theracane for TPR, pt reports he has one at home and plans on continuing to use it for pain relief. Sit to stand at Supervision level during session both with and without RW. Ambulation up to 200 ft both with and without RW at Supervision level. Pt has occasional LOB without use of RW but is able to self-correct. Ambulation through obstacle course going up/down 4" curb step, sidesteps with cone taps, and up/down wedge with CGA to min A for balance with no AD. Pt's family arrives towards end of session for hands-on family education. Reviewed car transfer, stairs, gait with family with pt at Supervision level with no AD and 2 handrails for stairs. Education with pt and family that he will be at mod I level upon d/c home and that he can use RW as needed for energy conservation and balance. Pt and family in agreement of pt's current LOF and LOF at d/c. Pt left seated in recliner in room with needs in reach, family present.  Therapy Documentation Precautions:  Precautions Precautions: Fall, Cervical Required Braces or Orthoses: Cervical Brace Cervical Brace: Hard collar, At all times (except for shower) Restrictions Weight Bearing Restrictions: No     Therapy/Group: Individual Therapy   Peter Congo, PT, DPT, CSRS  01/12/2022, 12:30 PM

## 2022-01-13 NOTE — Progress Notes (Signed)
Occupational Therapy Session Note  Patient Details  Name: Arthur Farrell MRN: [id number] Date of Birth: 26-Nov-1956  Today's Date: 01/14/2022 OT Individual Time: [id number] OT Individual Time Calculation (min): 94 min   Short Term Goals: Week 1:  OT Short Term Goal 1 (Week 1): STGs=LTGs due to ELOS  Skilled Therapeutic Interventions/Progress Updates:    Pt greeted in the recliner, pain manageable for tx. He completed toileting (using elevated toilet, B+B void) bathing/dressing (sit<stand at the sink) and oral care/grooming tasks (seated at sink) during session. Toilet transfer completed at ambulatory level using RW with Mod I. Mod I for BADLs today, pt selecting clothing using personal bag. His DME will be delivered to home tomorrow before he arrives. Pts personal scrotal sling also arrived before session. Pt transferred to the recliner at close of session, all needs within reach.   Therapy Documentation Precautions:  Precautions Precautions: Fall, Cervical Precaution Booklet Issued: No Precaution Comments: reviewed spinal precautions during mobility Required Braces or Orthoses: Cervical Brace Cervical Brace: Soft collar Restrictions Weight Bearing Restrictions: No Vital Signs:   Pain: Pain Assessment Pain Scale: 0-10 Pain Score: 2  Pain Type: Acute pain Pain Location: Scrotum Pain Orientation: Left Pain Descriptors / Indicators: Dull Pain Frequency: Intermittent Patients Stated Pain Goal: 0 Pain Intervention(s): Medication (See eMAR) (Premedicated for staple removal/dressing change.) 2nd Pain Site Pain Score: 5 Pain Type: Acute pain Pain Location: Scrotum Pain Descriptors / Indicators: Sharp Pain Frequency: Intermittent Patient's Stated Pain Goal: 2 Pain Intervention(s): Medication (See eMAR) ADL: ADL Eating: Set up Where Assessed-Eating: Chair Grooming: Independent Where Assessed-Grooming: Standing at sink Upper Body Bathing: Modified independent Where Assessed-Upper  Body Bathing: Sitting at sink Lower Body Bathing: Modified independent Where Assessed-Lower Body Bathing: Sitting at sink, Standing at sink Upper Body Dressing: Modified independent (Device) Where Assessed-Upper Body Dressing: Sitting at sink Lower Body Dressing: Modified independent Where Assessed-Lower Body Dressing: Sitting at sink, Standing at sink Toileting: Modified independent Where Assessed-Toileting: Glass blower/designer: Diplomatic Services operational officer Method: Ambulating (RW) Science writer: Raised toilet Print production planner: Chief Financial Officer Method: Heritage manager: Radio broadcast assistant Therapy/Group: Individual Therapy  Arthur Farrell Arthur Farrell 01/14/2022, 12:18 PM

## 2022-01-13 NOTE — Progress Notes (Signed)
PROGRESS NOTE   Subjective/Complaints:  Per RN, still having sig drainage of serosang fluid, appreciate CCS note, reviewed CT abd, discussed with pt and RN   ROS: Patient denies CP, SOB, N/V/D  Objective:   No results found. Recent Labs    01/12/22 0510  WBC 9.9  HGB 10.4*  HCT 32.5*  PLT 380     Recent Labs    01/12/22 0510  NA 137  K 3.3*  CL 102  CO2 26  GLUCOSE 109*  BUN 12  CREATININE 0.97  CALCIUM 8.8*     Intake/Output Summary (Last 24 hours) at 01/13/2022 0824 Last data filed at 01/13/2022 0500 Gross per 24 hour  Intake 720 ml  Output 325 ml  Net 395 ml         Physical Exam: Vital Signs Blood pressure 125/75, pulse 75, temperature 98.7 F (37.1 C), temperature source Oral, resp. rate 16, height 5\' 9"  (1.753 m), weight 111.2 kg, SpO2 97 %. Gen: no distress, normal appearing HEENT: oral mucosa pink and moist, NCAT Cardio: Reg rate Chest: normal effort, normal rate of breathing Abd: soft, non-distended Ext: no edema Psych: pleasant, normal affect Skin: intact  GU_ ongoing swelling of penis and testicles- slightly better- is elevated on towel Musculoskeletal:        General: LE edema  Skin:    General: Skin is warm and dry.     Comments: Suprapubic laceration with staples-bloody drainage, dressed.1.5cm transverse incision open , probed to 6cm to RIght and 5cm to left Neurological:     Mental Status: He is alert.     Comments: Alert and oriented x 3. Normal insight and awareness. Intact Memory. Normal language and speech. Cranial nerve exam unremarkable. 4/5 in BUE and BLE grossly   Assessment/Plan: 1. Functional deficits which require 3+ hours per day of interdisciplinary therapy in a comprehensive inpatient rehab setting. Physiatrist is providing close team supervision and 24 hour management of active medical problems listed below. Physiatrist and rehab team continue to assess  barriers to discharge/monitor patient progress toward functional and medical goals  Care Tool:  Bathing    Body parts bathed by patient: Right arm, Left arm, Chest, Abdomen, Front perineal area, Buttocks, Right upper leg, Left upper leg, Right lower leg, Left lower leg, Face         Bathing assist Assist Level: Supervision/Verbal cueing     Upper Body Dressing/Undressing Upper body dressing   What is the patient wearing?: Pull over shirt, Orthosis Orthosis activity level: Performed by patient  Upper body assist Assist Level: Set up assist    Lower Body Dressing/Undressing Lower body dressing      What is the patient wearing?: Pants, Underwear/pull up     Lower body assist Assist for lower body dressing: Set up assist     Toileting Toileting    Toileting assist Assist for toileting: Supervision/Verbal cueing     Transfers Chair/bed transfer  Transfers assist     Chair/bed transfer assist level: Supervision/Verbal cueing     Locomotion Ambulation   Ambulation assist      Assist level: Supervision/Verbal cueing Assistive device: No Device Max distance: 200'   Walk  10 feet activity   Assist     Assist level: Supervision/Verbal cueing Assistive device: No Device   Walk 50 feet activity   Assist Walk 50 feet with 2 turns activity did not occur: Safety/medical concerns  Assist level: Supervision/Verbal cueing Assistive device: No Device    Walk 150 feet activity   Assist Walk 150 feet activity did not occur: Safety/medical concerns  Assist level: Supervision/Verbal cueing Assistive device: No Device    Walk 10 feet on uneven surface  activity   Assist Walk 10 feet on uneven surfaces activity did not occur: Safety/medical concerns         Wheelchair     Assist Is the patient using a wheelchair?: Yes      Wheelchair assist level: Minimal Assistance - Patient > 75% Max wheelchair distance: 141ft    Wheelchair 50 feet  with 2 turns activity    Assist        Assist Level: Minimal Assistance - Patient > 75%   Wheelchair 150 feet activity     Assist      Assist Level: Moderate Assistance - Patient 50 - 74%   Blood pressure 125/75, pulse 75, temperature 98.7 F (37.1 C), temperature source Oral, resp. rate 16, height 5\' 9"  (1.753 m), weight 111.2 kg, SpO2 97 %.  Medical Problem List and Plan: 1. 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 non-displaced anterior vertebral body fracture at C7-T1 through osteophyte. Patient may remove c-collar to shower. Will switch to soft collar             -ELOS/Goals: 14-20 days, supervision to min assist goals with PT and OT  -Continue CIR therapies including PT and OT. Interdisciplinary team conference today to discuss goals, barriers to discharge, and dc planning.  2.  Antithrombotics: -DVT/anticoagulation:  Pharmaceutical: Heparin             -antiplatelet therapy: none 3. Post-traumatic pain: continue Tramadol 50 mg q6 hours prn for pain 4. Mood: Lcsw to evaluate and provide emotional support             -antipsychotic agents: n/a 5. Neuropsych: This patient is capable of making decisions on his own behalf. 6. Scrotal edema: continue scrotal sling. Add lasix 10mg  daily. Can uptitrate if swelling persists and patient not disturbed by increased urination. Cr normal.   Suprapubic fluid collection, as per recent CT and surgery note, will cont to monitor Hgb, pack wound, last CCS note indicates plan to remove remaining sutures prior to D/C , have sent them message  7. Fluids/Electrolytes/Nutrition: routine Is and Os and follow-up chemistries 8: DM-insulin requiring: Hgb A1c = 6.1 --restart metformin 250mg  daily as per patient's request. D/c ISS -CBG (last 3)  Recent  Labs    01/12/22 0007 01/12/22 0637 01/12/22 1155  GLUCAP 97 110* 94   1/10 -continue with SSI as we address ileus 9. Hypertension: continue Norvasc, HCTZ, Zestril Vitals:   01/12/22 1950 01/13/22 0425  BP: 136/85 125/75  Pulse: 75 75  Resp: 16 16  Temp: 99 F (37.2 C) 98.7 F (37.1 C)  SpO2: 97% 97%    10: Hyperlipidemia: continue Lipitor 11: Depression: well controlled on Wellbutrin (home med continued)   13: Mild hypokalemia: continue K+ runs and with  IVF  14. Colonic ileus:   -GI following, s/p endoscopic decompression -d/c NGT -CT abdomen pelvis shows likely hematoma -KUB improved -advance to regular diet 15. Constipation: start colace 100mg  BID 16. Hypokalemia: supplemented 1/13.     LOS: 8 days A FACE TO FACE EVALUATION WAS PERFORMED  Charlett Blake 01/13/2022, 8:24 AM

## 2022-01-13 NOTE — Progress Notes (Signed)
Informed patient that there is an order to remove staples on incision. Patient refused and told this RN that he will talk/clarify with MD in the morning. Suprapubic incision has large amount of serosanguineous drainage. Dressing changed.

## 2022-01-13 NOTE — Progress Notes (Signed)
Pt refusing CPAP tonight. Advised pt to notify for RT if he changes his mind. RT will monitor as needed.
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
## 2022-01-14 ENCOUNTER — Inpatient Hospital Stay (HOSPITAL_COMMUNITY): Payer: No Typology Code available for payment source

## 2022-01-14 IMAGING — DX DG ABDOMEN 1V
1 series · 2 of 2 positions shown · non-contrast
Comparison: [DATE]

CLINICAL DATA: Abdominal distention

EXAM:
ABDOMEN - 1 VIEW

[Series 1: abdomen · 0.14mm/px · 2 of 2 slices shown]
[im 1/2]
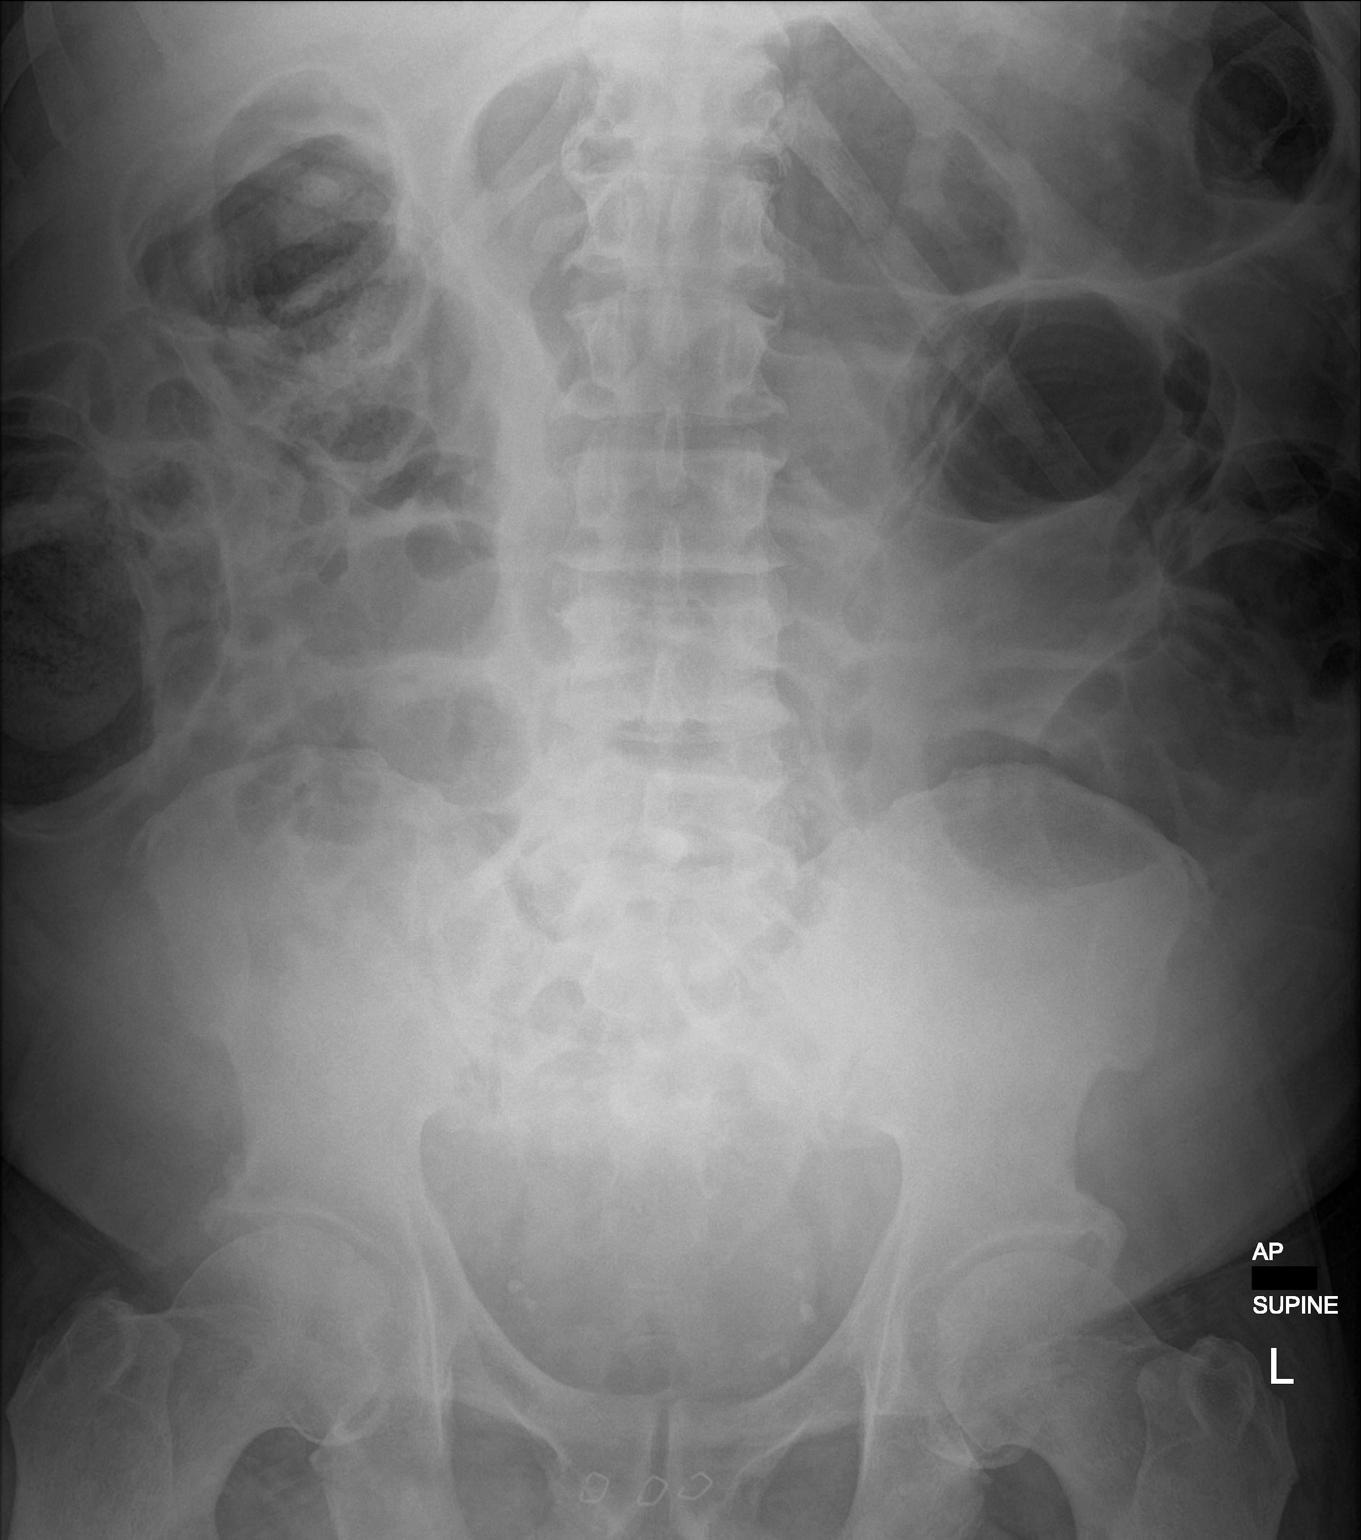
[im 2/2]
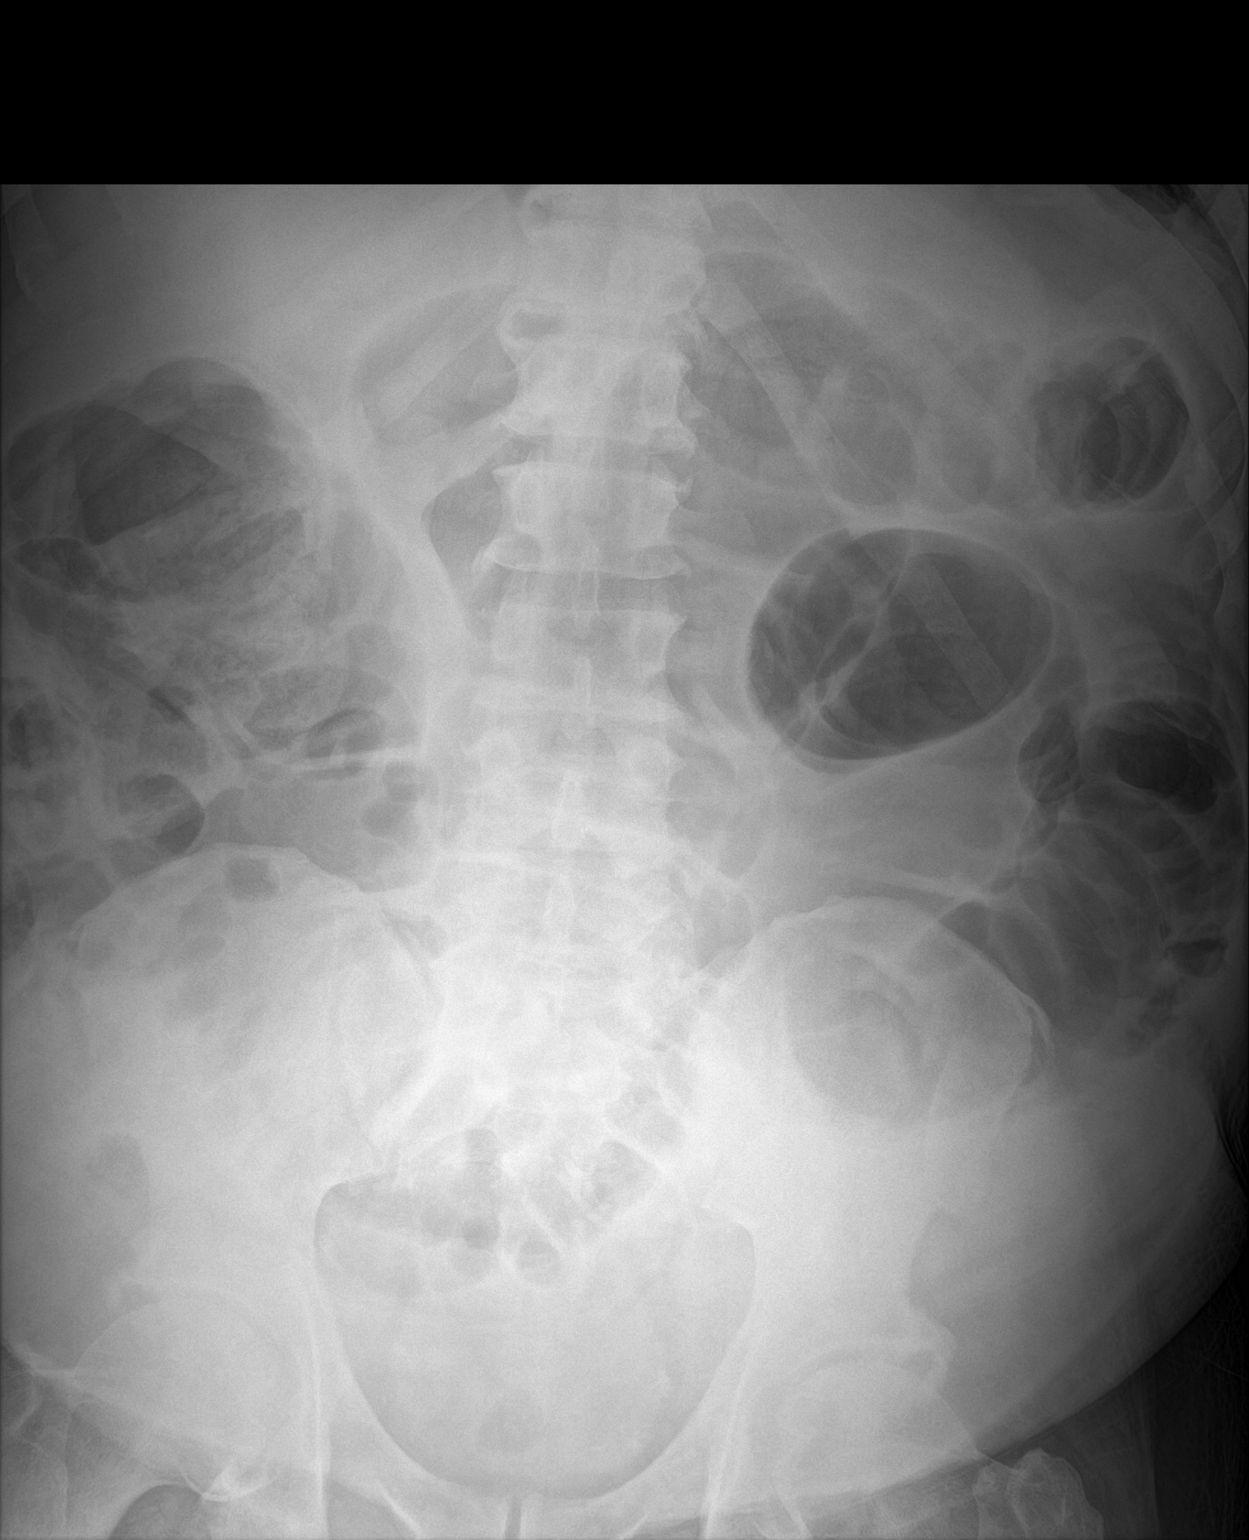

[2 of 2 positions shown; findings below may reference images not displayed]

FINDINGS: There is interval removal of NG tube. There is moderate gaseous
distention of colon. There is no significant small bowel dilation.
Stomach is not distended. No abnormal masses or calcifications are
seen. Kidneys are partly obscured by bowel contents limiting
evaluation for renal stones.
IMPRESSION: There is moderate distention of colon, possibly suggesting ileus.
Less likely possibility would be distal colonic obstruction. There
is no significant small bowel dilation. Stomach is not distended.
There is interval removal of NG tube.

## 2022-01-14 NOTE — Progress Notes (Signed)
Pt refusing CPAP tonight. Asked pt if he wanted me to remove machine from room since he had refused the past few nights. Pt said it was okay to remove, he wasn't planning on wearing it again, and hopes to be discharged tomorrow. Machine removed at this time and order discontinued per RT Protocol. Pt aware he can request CPAP at any time.

## 2022-01-14 NOTE — Plan of Care (Signed)
Problem: RH Toileting Goal: LTG Patient will perform toileting task (3/3 steps) with assistance level (OT) Description: LTG: Patient will perform toileting task (3/3 steps) with assistance level (OT)  Outcome: Completed/Met

## 2022-01-14 NOTE — Progress Notes (Addendum)
PROGRESS NOTE   Subjective/Complaints:  Per RN, still having sig drainage of serosang fluid, appreciate CCS note, reviewed CT abd, discussed with pt and RN   Still with problems moving bowels  ROS: Patient denies CP, SOB, N/V/D  Objective:   No results found. Recent Labs    01/12/22 0510  WBC 9.9  HGB 10.4*  HCT 32.5*  PLT 380     Recent Labs    01/12/22 0510  NA 137  K 3.3*  CL 102  CO2 26  GLUCOSE 109*  BUN 12  CREATININE 0.97  CALCIUM 8.8*     Intake/Output Summary (Last 24 hours) at 01/14/2022 0824 Last data filed at 01/13/2022 2113 Gross per 24 hour  Intake 360 ml  Output --  Net 360 ml         Physical Exam: Vital Signs Blood pressure 127/88, pulse 75, temperature 98.1 F (36.7 C), temperature source Oral, resp. rate 20, height 5\' 9"  (1.753 m), weight 111.2 kg, SpO2 100 %. Gen: no distress, normal appearing HEENT: oral mucosa pink and moist, NCAT Cardio: Reg rate Chest: normal effort, normal rate of breathing Abd: soft, non-distended Ext: no edema Psych: pleasant, normal affect Skin: intact  GU_ ongoing swelling of penis and testicles- slightly better- is elevated on towel Musculoskeletal:        General: LE edema  Skin:    General: Skin is warm and dry.     Comments: Suprapubic laceration with staples-bloody drainage, dressed.1.5cm transverse incision open , probed to 6cm to RIght and 5cm to left Neurological:     Mental Status: He is alert.     Comments: Alert and oriented x 3. Normal insight and awareness. Intact Memory. Normal language and speech. Cranial nerve exam unremarkable. 4/5 in BUE and BLE grossly   Assessment/Plan: 1. Functional deficits which require 3+ hours per day of interdisciplinary therapy in a comprehensive inpatient rehab setting. Physiatrist is providing close team supervision and 24 hour management of active medical problems listed below. Physiatrist and  rehab team continue to assess barriers to discharge/monitor patient progress toward functional and medical goals  Care Tool:  Bathing    Body parts bathed by patient: Right arm, Left arm, Chest, Abdomen, Front perineal area, Buttocks, Right upper leg, Left upper leg, Right lower leg, Left lower leg, Face         Bathing assist Assist Level: Supervision/Verbal cueing     Upper Body Dressing/Undressing Upper body dressing   What is the patient wearing?: Pull over shirt, Orthosis Orthosis activity level: Performed by patient  Upper body assist Assist Level: Set up assist    Lower Body Dressing/Undressing Lower body dressing      What is the patient wearing?: Pants, Underwear/pull up     Lower body assist Assist for lower body dressing: Set up assist     Toileting Toileting    Toileting assist Assist for toileting: Supervision/Verbal cueing     Transfers Chair/bed transfer  Transfers assist     Chair/bed transfer assist level: Supervision/Verbal cueing     Locomotion Ambulation   Ambulation assist      Assist level: Supervision/Verbal cueing Assistive device: No Device Max  distance: 200'   Walk 10 feet activity   Assist     Assist level: Supervision/Verbal cueing Assistive device: No Device   Walk 50 feet activity   Assist Walk 50 feet with 2 turns activity did not occur: Safety/medical concerns  Assist level: Supervision/Verbal cueing Assistive device: No Device    Walk 150 feet activity   Assist Walk 150 feet activity did not occur: Safety/medical concerns  Assist level: Supervision/Verbal cueing Assistive device: No Device    Walk 10 feet on uneven surface  activity   Assist Walk 10 feet on uneven surfaces activity did not occur: Safety/medical concerns         Wheelchair     Assist Is the patient using a wheelchair?: Yes      Wheelchair assist level: Minimal Assistance - Patient > 75% Max wheelchair distance:  124ft    Wheelchair 50 feet with 2 turns activity    Assist        Assist Level: Minimal Assistance - Patient > 75%   Wheelchair 150 feet activity     Assist      Assist Level: Moderate Assistance - Patient 50 - 74%   Blood pressure 127/88, pulse 75, temperature 98.1 F (36.7 C), temperature source Oral, resp. rate 20, height 5\' 9"  (1.753 m), weight 111.2 kg, SpO2 100 %.  Medical Problem List and Plan: 1. 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 non-displaced anterior vertebral body fracture at C7-T1 through osteophyte. Patient may remove c-collar to shower. Will switch to soft collar             -ELOS/Goals: 14-20 days, supervision to min assist goals with PT and OT  -Continue CIR therapies including PT and OT. Interdisciplinary team conference today to discuss goals, barriers to discharge, and dc planning.  2.  Antithrombotics: -DVT/anticoagulation:  Pharmaceutical: Heparin             -antiplatelet therapy: none 3. Post-traumatic pain: continue Tramadol 50 mg q6 hours prn for pain 4. Mood: Lcsw to evaluate and provide emotional support             -antipsychotic agents: n/a 5. Neuropsych: This patient is capable of making decisions on his own behalf. 6. Scrotal edema: continue scrotal sling. Add lasix 10mg  daily. Can uptitrate if swelling persists and patient not disturbed by increased urination. Cr normal.   Suprapubic fluid collection, as per recent CT and surgery note, will cont to monitor Hgb, pack wound, last CCS note indicates plan to remove remaining sutures prior to D/C , have sent them message  7. Fluids/Electrolytes/Nutrition: routine Is and Os and follow-up chemistries 8: DM-insulin requiring: Hgb A1c = 6.1 --restart metformin 250mg  daily as per patient's request.  D/c ISS -CBG (last 3)  Recent Labs    01/12/22 0007 01/12/22 0637 01/12/22 1155  GLUCAP 97 110* 94   Controlled 1/15 9. Hypertension: continue Norvasc, HCTZ, Zestril Vitals:   01/13/22 1933 01/14/22 0418  BP: 128/77 127/88  Pulse: 73 75  Resp: 18 20  Temp: 98 F (36.7 C) 98.1 F (36.7 C)  SpO2: 99% 100%    10: Hyperlipidemia: continue Lipitor 11: Depression: well controlled on Wellbutrin (home med continued)   13: Mild hypokalemia: continue K+ runs and with IVF  14. Colonic ileus:   -improved, had small BM yesterday but still distended with hi pitched bowel sounds will re check KUB 15. Constipation: start colace 100mg  BID 16. Hypokalemia: supplemented 1/13.     LOS: 9 days A FACE TO FACE EVALUATION WAS PERFORMED  2/13 01/14/2022, 8:24 AM

## 2022-01-14 NOTE — Progress Notes (Signed)
Remaining staples removed from pt per order. Pt tolerated removal of 3 staples well, large amount of serosanguinous drainage noted from open wound, but no drainage from staple removal site. New dressing placed. Pt voices no complaints.

## 2022-01-14 NOTE — Progress Notes (Signed)
Inpatient Rehabilitation Discharge Medication Review by a Pharmacist  A complete drug regimen review was completed for this patient to identify any potential clinically significant medication issues.  High Risk Drug Classes Is patient taking? Indication by Medication  Antipsychotic No   Anticoagulant No   Antibiotic No   Opioid No   Antiplatelet No   Hypoglycemics/insulin Yes Metformin for DM  Vasoactive Medication Yes HCTZ, lisinopril for BP  Chemotherapy No   Other No Wellbutrin for mood     Type of Medication Issue Identified Description of Issue Recommendation(s)  Drug Interaction(s) (clinically significant)     Duplicate Therapy     Allergy     No Medication Administration End Date     Incorrect Dose     Additional Drug Therapy Needed     Significant med changes from prior encounter (inform family/care partners about these prior to discharge).    Other       Clinically significant medication issues were identified that warrant physician communication and completion of prescribed/recommended actions by midnight of the next day:  No   Pharmacist comments: None  Time spent performing this drug regimen review (minutes):  20 minutes   Arthur Farrell 01/14/2022 7:16 AM

## 2022-01-15 MED ORDER — FUROSEMIDE 20 MG PO TABS: 10.0000 mg | ORAL_TABLET | Freq: Every day | ORAL | 0 refills | Status: AC

## 2022-01-15 MED ORDER — METFORMIN HCL 500 MG PO TABS: 250.0000 mg | ORAL_TABLET | Freq: Every day | ORAL | 0 refills | Status: AC

## 2022-01-15 MED ORDER — DOCUSATE SODIUM 100 MG PO CAPS: 100.0000 mg | ORAL_CAPSULE | Freq: Two times a day (BID) | ORAL | 0 refills | Status: AC

## 2022-01-15 MED ORDER — ACETAMINOPHEN 325 MG PO TABS
650.0000 mg | ORAL_TABLET | ORAL | Status: AC | PRN
Start: 2022-01-15 — End: ?

## 2022-01-15 MED ORDER — POLYETHYLENE GLYCOL 3350 17 G PO PACK: 17.0000 g | PACK | Freq: Every day | ORAL | 0 refills | Status: AC | PRN

## 2022-01-15 MED ORDER — FEXOFENADINE HCL 60 MG PO TABS: 60.0000 mg | ORAL_TABLET | Freq: Two times a day (BID) | ORAL | 0 refills | Status: AC

## 2022-01-15 MED ORDER — TRAMADOL HCL 50 MG PO TABS: 50.0000 mg | ORAL_TABLET | Freq: Three times a day (TID) | ORAL | 0 refills | Status: AC | PRN

## 2022-01-15 NOTE — Progress Notes (Signed)
PROGRESS NOTE   Subjective/Complaints:  Pt reports just had a formed BM- going better.  They pulled the sutures/staples out of lower pelvic incision yesterday- still having moderate + amount of drainage- but "not red anymore".  Not using IV- ready for d/c today.    ROS:  Pt denies SOB, abd pain, CP, N/V/C/D, and vision changes   Objective:   DG Abd 1 View  Result Date: 01/14/2022 CLINICAL DATA:  Abdominal distention EXAM: ABDOMEN - 1 VIEW COMPARISON:  01/09/2022 FINDINGS: There is interval removal of NG tube. There is moderate gaseous distention of colon. There is no significant small bowel dilation. Stomach is not distended. No abnormal masses or calcifications are seen. Kidneys are partly obscured by bowel contents limiting evaluation for renal stones. IMPRESSION: There is moderate distention of colon, possibly suggesting ileus. Less likely possibility would be distal colonic obstruction. There is no significant small bowel dilation. Stomach is not distended. There is interval removal of NG tube. Electronically Signed   By: Elmer Picker M.D.   On: 01/14/2022 12:32   No results for input(s): WBC, HGB, HCT, PLT in the last 72 hours.  No results for input(s): NA, K, CL, CO2, GLUCOSE, BUN, CREATININE, CALCIUM in the last 72 hours.  Intake/Output Summary (Last 24 hours) at 01/15/2022 0811 Last data filed at 01/14/2022 1800 Gross per 24 hour  Intake 712 ml  Output --  Net 712 ml        Physical Exam: Vital Signs Blood pressure 120/88, pulse 65, temperature 98.9 F (37.2 C), temperature source Oral, resp. rate 14, height 5\' 9"  (1.753 m), weight 111.2 kg, SpO2 93 %.    General: awake, alert, appropriate, walking around room- is I in room now, sat in bedside chair., wearing soft collar; NAD HENT: conjugate gaze; oropharynx moist; soft collar CV: regular rate; no JVD Pulmonary: CTA B/L; no W/R/R- good air movement GI:  soft, NT; appears distended, but pt says at baseline- normoactive BS Psychiatric: appropriate- interactive Neurological: Ox3  GU_ ongoing swelling of penis and testicles- slightly better- is elevated on towel Musculoskeletal:        General: LE edema  Skin:    General: Skin is warm and dry.     Comments: Suprapubic incision draining serous drainage- dressing soaked  since 11pm last night- staples out; Suprapubic laceration with staples-bloody drainage, dressed.1.5cm transverse incision open , probed to 6cm to RIght and 5cm to left Neurological:     Mental Status: He is alert.     Comments: Alert and oriented x 3. Normal insight and awareness. Intact Memory. Normal language and speech. Cranial nerve exam unremarkable. 4/5 in BUE and BLE grossly   Assessment/Plan: 1. Functional deficits which require 3+ hours per day of interdisciplinary therapy in a comprehensive inpatient rehab setting. Physiatrist is providing close team supervision and 24 hour management of active medical problems listed below. Physiatrist and rehab team continue to assess barriers to discharge/monitor patient progress toward functional and medical goals  Care Tool:  Bathing    Body parts bathed by patient: Right arm, Left arm, Chest, Abdomen, Front perineal area, Buttocks, Right upper leg, Left upper leg, Right lower leg, Left lower  leg, Face         Bathing assist Assist Level: Independent with assistive device     Upper Body Dressing/Undressing Upper body dressing   What is the patient wearing?: Pull over shirt, Orthosis Orthosis activity level: Performed by patient  Upper body assist Assist Level: Independent with assistive device    Lower Body Dressing/Undressing Lower body dressing      What is the patient wearing?: Pants, Underwear/pull up     Lower body assist Assist for lower body dressing: Independent with assitive device     Toileting Toileting    Toileting assist Assist for toileting:  Independent with assistive device     Transfers Chair/bed transfer  Transfers assist     Chair/bed transfer assist level: Independent with assistive device Chair/bed transfer assistive device: Other   Locomotion Ambulation   Ambulation assist      Assist level: Supervision/Verbal cueing Assistive device: No Device Max distance: 200'   Walk 10 feet activity   Assist     Assist level: Supervision/Verbal cueing Assistive device: No Device   Walk 50 feet activity   Assist Walk 50 feet with 2 turns activity did not occur: Safety/medical concerns  Assist level: Supervision/Verbal cueing Assistive device: No Device    Walk 150 feet activity   Assist Walk 150 feet activity did not occur: Safety/medical concerns  Assist level: Supervision/Verbal cueing Assistive device: No Device    Walk 10 feet on uneven surface  activity   Assist Walk 10 feet on uneven surfaces activity did not occur: Safety/medical concerns   Assist level: Supervision/Verbal cueing     Wheelchair     Assist Is the patient using a wheelchair?: No      Wheelchair assist level: Minimal Assistance - Patient > 75% Max wheelchair distance: 138ft    Wheelchair 50 feet with 2 turns activity    Assist        Assist Level: Minimal Assistance - Patient > 75%   Wheelchair 150 feet activity     Assist      Assist Level: Moderate Assistance - Patient 50 - 74%   Blood pressure 120/88, pulse 65, temperature 98.9 F (37.2 C), temperature source Oral, resp. rate 14, height 5\' 9"  (1.753 m), weight 111.2 kg, SpO2 93 %.  Medical Problem List and Plan: 1. 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 non-displaced anterior vertebral body fracture at C7-T1 through osteophyte. Patient may  remove c-collar to shower. Will switch to soft collar             -ELOS/Goals: 14-20 days, supervision to min assist goals with PT and OT  -Continue CIR therapies including PT and OT. Interdisciplinary team conference today to discuss goals, barriers to discharge, and dc planning.   D/c today- will need f/u with me; surgery, and PCP- has PCP appt tomorrow 2.  Antithrombotics: -DVT/anticoagulation:  Pharmaceutical: Heparin             -antiplatelet therapy: none 3. Post-traumatic pain: continue Tramadol 50 mg q6 hours prn for pain  1/16- will send home on tramadol  TID prn- #21.  4. Mood: Lcsw to evaluate and provide emotional support             -antipsychotic agents: n/a 5.  Neuropsych: This patient is capable of making decisions on his own behalf. 6. Scrotal edema: continue scrotal sling. Add lasix 10mg  daily. Can uptitrate if swelling persists and patient not disturbed by increased urination. Cr normal.   Suprapubic fluid collection, as per recent CT and surgery note, will cont to monitor Hgb, pack wound, last CCS note indicates plan to remove remaining sutures prior to D/C , have sent them message   1/16- sutures out- still draining- they said no PO ABX, since just draining serous, but if it changes, suggest PCP add PO ABX.  7. Fluids/Electrolytes/Nutrition: routine Is and Os and follow-up chemistries 8: DM-insulin requiring: Hgb A1c = 6.1 --restart metformin 250mg  daily as per patient's request. D/c ISS -CBG (last 3)  Recent Labs    01/12/22 1155  GLUCAP 94  Controlled 1/15 9. Hypertension: continue Norvasc, HCTZ, Zestril Vitals:   01/14/22 1948 01/15/22 0543  BP: 133/83 120/88  Pulse: 74 65  Resp: 18 14  Temp: 98.5 F (36.9 C) 98.9 F (37.2 C)  SpO2: 98% 93%    10: Hyperlipidemia: continue Lipitor 11: Depression: well controlled on Wellbutrin (home med continued)   13: Mild hypokalemia: continue K+ runs and with IVF  14. Colonic ileus:   -improved, had small BM yesterday  but still distended with hi pitched bowel sounds will re check KUB  1/16- Pt had large formed BM this AM- more normoactive BS this AM- con't over the counter bowel meds.   15. Constipation: start colace 100mg  BID 16. Hypokalemia: supplemented 1/13.     LOS: 10 days A FACE TO FACE EVALUATION WAS PERFORMED  Byrd Terrero 01/15/2022, 8:11 AM

## 2022-01-15 NOTE — Plan of Care (Signed)
Pt d/c to home

## 2022-01-15 NOTE — Progress Notes (Signed)
Pt being discharged at this time. PA previously in room to go over discharge paperwork. All personal belongings as well as dressing supplies packed and sent with pt upon discharge. Pt transported off floor via wheelchair accompanied by staff.

## 2022-01-15 NOTE — Progress Notes (Signed)
Inpatient Rehabilitation Care Coordinator Discharge Note   Patient Details  Name: Arthur Farrell MRN: [id number] Date of Birth: 1956-09-07   Discharge location:  HOME ALONE WITH SISTER CHECKING ON FREQUENTLY SINCE LIVES SO CLOSE TO  Length of Stay: 10 days  Discharge activity level:  MOD/I LEVEL  Home/community participation: active  Patient response JA:SNKNLZ Literacy - How often do you need to have someone help you when you read instructions, pamphlets, or other written material from your doctor or pharmacy?: Never  Patient response JQ:BHALPF Isolation - How often do you feel lonely or isolated from those around you?: Never  Services provided included: MD, RD, PT, OT, SLP, RN, CM, Pharmacy, SW  Financial Services:  Financial Services Utilized: [employer]  Choices offered to/list presented to: PT  Follow-up services arranged:  Outpatient, DME, Patient/Family has no preference for HH/DME agencies    Outpatient Servicies: [facility] WILL CALL THEM TO SCHEDULE APPOINTMENTS [facility]ME : [facility]    Patient response to transportation need: Is the patient able to respond to transportation needs?: Yes In the past 12 months, has lack of transportation kept you from medical appointments or from getting medications?: No In the past 12 months, has lack of transportation kept you from meetings, work, or from getting things needed for daily living?: No    Comments (or additional information): PT DID WELL AND FEELS READY TO DC HOME TODAY  Patient/Family verbalized understanding of follow-up arrangements:  Yes  Individual responsible for coordination of the follow-up plan: SELF AND SISTER-LATTRELL [phone]  Confirmed correct DME delivered: Lucy Chris 01/15/2022    Joleene Burnham, Lemar Livings

## 2022-02-09 ENCOUNTER — Encounter: Payer: No Typology Code available for payment source | Admitting: Physical Medicine and Rehabilitation
